# Patient Record
Sex: Female | Born: 1990 | Race: White | Hispanic: No | Marital: Married | Smoking: Never smoker
Health system: Southern US, Community
[De-identification: ages and names within clinical notes are randomized; demographics above are authoritative.]

## PROBLEM LIST (undated history)

## (undated) DIAGNOSIS — N871 Moderate cervical dysplasia: Secondary | ICD-10-CM

## (undated) DIAGNOSIS — F329 Major depressive disorder, single episode, unspecified: Secondary | ICD-10-CM

## (undated) DIAGNOSIS — F419 Anxiety disorder, unspecified: Secondary | ICD-10-CM

## (undated) DIAGNOSIS — F32A Depression, unspecified: Secondary | ICD-10-CM

## (undated) DIAGNOSIS — Z9889 Other specified postprocedural states: Secondary | ICD-10-CM

## (undated) HISTORY — DX: Moderate cervical dysplasia: N87.1

## (undated) HISTORY — DX: Other specified postprocedural states: Z98.890

---
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
## 2016-02-01 HISTORY — PX: OTHER SURGICAL HISTORY: SHX169

## 2018-03-16 DIAGNOSIS — Z0000 Encounter for general adult medical examination without abnormal findings: Secondary | ICD-10-CM | POA: Diagnosis not present

## 2018-04-05 DIAGNOSIS — F321 Major depressive disorder, single episode, moderate: Secondary | ICD-10-CM | POA: Diagnosis not present

## 2018-04-10 DIAGNOSIS — F321 Major depressive disorder, single episode, moderate: Secondary | ICD-10-CM | POA: Diagnosis not present

## 2018-04-16 DIAGNOSIS — F321 Major depressive disorder, single episode, moderate: Secondary | ICD-10-CM | POA: Diagnosis not present

## 2018-04-24 DIAGNOSIS — F321 Major depressive disorder, single episode, moderate: Secondary | ICD-10-CM | POA: Diagnosis not present

## 2018-05-14 DIAGNOSIS — F321 Major depressive disorder, single episode, moderate: Secondary | ICD-10-CM | POA: Diagnosis not present

## 2018-05-28 DIAGNOSIS — F321 Major depressive disorder, single episode, moderate: Secondary | ICD-10-CM | POA: Diagnosis not present

## 2018-06-27 DIAGNOSIS — F321 Major depressive disorder, single episode, moderate: Secondary | ICD-10-CM | POA: Diagnosis not present

## 2019-03-22 DIAGNOSIS — Z131 Encounter for screening for diabetes mellitus: Secondary | ICD-10-CM | POA: Diagnosis not present

## 2019-03-22 DIAGNOSIS — Z23 Encounter for immunization: Secondary | ICD-10-CM | POA: Diagnosis not present

## 2019-03-22 DIAGNOSIS — Z0000 Encounter for general adult medical examination without abnormal findings: Secondary | ICD-10-CM | POA: Diagnosis not present

## 2019-03-22 DIAGNOSIS — E559 Vitamin D deficiency, unspecified: Secondary | ICD-10-CM | POA: Diagnosis not present

## 2019-03-22 DIAGNOSIS — Z13220 Encounter for screening for lipoid disorders: Secondary | ICD-10-CM | POA: Diagnosis not present

## 2019-04-03 ENCOUNTER — Other Ambulatory Visit: Payer: Self-pay | Admitting: Family Medicine

## 2019-04-03 ENCOUNTER — Other Ambulatory Visit (HOSPITAL_COMMUNITY)
Admission: RE | Admit: 2019-04-03 | Discharge: 2019-04-03 | Disposition: A | Discharge status: Home / Self Care | Payer: BC Managed Care – PPO | Source: Ambulatory Visit | Attending: Family Medicine | Admitting: Family Medicine

## 2019-04-03 DIAGNOSIS — Z124 Encounter for screening for malignant neoplasm of cervix: Secondary | ICD-10-CM | POA: Insufficient documentation

## 2019-04-08 ENCOUNTER — Other Ambulatory Visit: Payer: Self-pay | Admitting: Orthopedic Surgery

## 2019-04-08 DIAGNOSIS — M67432 Ganglion, left wrist: Secondary | ICD-10-CM | POA: Diagnosis not present

## 2019-04-12 LAB — CYTOLOGY - PAP
Comment: NEGATIVE
Comment: NEGATIVE
Diagnosis: HIGH — AB
HPV 16: NEGATIVE
HPV 18 / 45: NEGATIVE
High risk HPV: POSITIVE — AB

## 2019-04-25 ENCOUNTER — Ambulatory Visit: Payer: BC Managed Care – PPO | Attending: Internal Medicine

## 2019-04-25 DIAGNOSIS — Z23 Encounter for immunization: Secondary | ICD-10-CM

## 2019-04-25 NOTE — Progress Notes (Signed)
   Covid-19 Vaccination Clinic  Name:  Ashley [name]    MRN: [id number] DOB: 01-11-1991  04/25/2019  Ms. Curl was observed post Covid-19 immunization for 15 minutes without incident. She was provided with Vaccine Information Sheet and instruction to access the V-Safe system.   [name] was instructed to call 911 with any severe reactions post vaccine: Marland Kitchen Difficulty breathing  . Swelling of face and throat  . A fast heartbeat  . A bad rash all over body  . Dizziness and weakness   Immunizations Administered    Name Date Dose VIS Date Route   Pfizer COVID-19 Vaccine 04/25/2019 10:13 AM 0.3 mL 01/11/2019 Intramuscular   Manufacturer: [employer], [employer]   Lot: [id number]   NDC: [address]-5

## 2019-05-02 DIAGNOSIS — Z3169 Encounter for other general counseling and advice on procreation: Secondary | ICD-10-CM | POA: Diagnosis not present

## 2019-05-02 DIAGNOSIS — N871 Moderate cervical dysplasia: Secondary | ICD-10-CM | POA: Diagnosis not present

## 2019-05-02 DIAGNOSIS — Z8759 Personal history of other complications of pregnancy, childbirth and the puerperium: Secondary | ICD-10-CM | POA: Diagnosis not present

## 2019-05-02 DIAGNOSIS — Z3202 Encounter for pregnancy test, result negative: Secondary | ICD-10-CM | POA: Diagnosis not present

## 2019-05-20 ENCOUNTER — Ambulatory Visit: Payer: BC Managed Care – PPO | Attending: Internal Medicine

## 2019-05-20 DIAGNOSIS — Z3169 Encounter for other general counseling and advice on procreation: Secondary | ICD-10-CM | POA: Diagnosis not present

## 2019-05-20 DIAGNOSIS — Z131 Encounter for screening for diabetes mellitus: Secondary | ICD-10-CM | POA: Diagnosis not present

## 2019-05-20 DIAGNOSIS — Z23 Encounter for immunization: Secondary | ICD-10-CM

## 2019-05-20 NOTE — Progress Notes (Signed)
   Covid-19 Vaccination Clinic  Name:  Ashley [name]    MRN: [id number] DOB: 1990-07-29  05/20/2019  [name] was observed post Covid-19 immunization for 15 minutes without incident. She was provided with Vaccine Information Sheet and instruction to access the V-Safe system.   [name] was instructed to call 911 with any severe reactions post vaccine: Marland Kitchen Difficulty breathing  . Swelling of face and throat  . A fast heartbeat  . A bad rash all over body  . Dizziness and weakness   Immunizations Administered    Name Date Dose VIS Date Route   Pfizer COVID-19 Vaccine 05/20/2019  9:52 AM 0.3 mL 03/27/2018 Intramuscular   Manufacturer: [employer], [employer]   Lot: [id number]   NDC: [address]-0

## 2019-05-22 ENCOUNTER — Encounter (HOSPITAL_BASED_OUTPATIENT_CLINIC_OR_DEPARTMENT_OTHER): Payer: Self-pay | Admitting: Orthopedic Surgery

## 2019-05-22 ENCOUNTER — Other Ambulatory Visit: Payer: Self-pay

## 2019-05-24 ENCOUNTER — Other Ambulatory Visit (HOSPITAL_COMMUNITY)
Admission: RE | Admit: 2019-05-24 | Discharge: 2019-05-24 | Disposition: A | Discharge status: Home / Self Care | Payer: BC Managed Care – PPO | Source: Ambulatory Visit | Attending: Orthopedic Surgery | Admitting: Orthopedic Surgery

## 2019-05-24 DIAGNOSIS — Z20822 Contact with and (suspected) exposure to covid-19: Secondary | ICD-10-CM | POA: Insufficient documentation

## 2019-05-24 DIAGNOSIS — Z01812 Encounter for preprocedural laboratory examination: Secondary | ICD-10-CM | POA: Diagnosis not present

## 2019-05-24 LAB — SARS CORONAVIRUS 2 (TAT 6-24 HRS): SARS Coronavirus 2: NEGATIVE

## 2019-05-28 ENCOUNTER — Ambulatory Visit (HOSPITAL_BASED_OUTPATIENT_CLINIC_OR_DEPARTMENT_OTHER): Payer: BC Managed Care – PPO | Admitting: Anesthesiology

## 2019-05-28 ENCOUNTER — Other Ambulatory Visit: Payer: Self-pay

## 2019-05-28 ENCOUNTER — Ambulatory Visit (HOSPITAL_BASED_OUTPATIENT_CLINIC_OR_DEPARTMENT_OTHER)
Admission: RE | Admit: 2019-05-28 | Discharge: 2019-05-28 | Disposition: A | Discharge status: Home / Self Care | Payer: BC Managed Care – PPO | Attending: Orthopedic Surgery | Admitting: Orthopedic Surgery

## 2019-05-28 ENCOUNTER — Encounter (HOSPITAL_BASED_OUTPATIENT_CLINIC_OR_DEPARTMENT_OTHER): Payer: Self-pay | Admitting: Orthopedic Surgery

## 2019-05-28 ENCOUNTER — Encounter (HOSPITAL_BASED_OUTPATIENT_CLINIC_OR_DEPARTMENT_OTHER)
Admission: RE | Disposition: A | Discharge status: Home / Self Care | Payer: Self-pay | Source: Home / Self Care | Attending: Orthopedic Surgery

## 2019-05-28 DIAGNOSIS — R2232 Localized swelling, mass and lump, left upper limb: Secondary | ICD-10-CM | POA: Diagnosis not present

## 2019-05-28 DIAGNOSIS — M67432 Ganglion, left wrist: Secondary | ICD-10-CM | POA: Diagnosis not present

## 2019-05-28 HISTORY — PX: CYST EXCISION: SHX5701

## 2019-05-28 HISTORY — DX: Anxiety disorder, unspecified: F41.9

## 2019-05-28 HISTORY — DX: Depression, unspecified: F32.A

## 2019-05-28 LAB — POCT PREGNANCY, URINE: Preg Test, Ur: NEGATIVE

## 2019-05-28 SURGERY — CYST REMOVAL
Anesthesia: Monitor Anesthesia Care | Site: Wrist | Laterality: Left

## 2019-05-28 MED ORDER — FENTANYL CITRATE (PF) 100 MCG/2ML IJ SOLN: 50.0000 ug | INTRAMUSCULAR | Status: DC | PRN

## 2019-05-28 MED ORDER — FENTANYL CITRATE (PF) 250 MCG/5ML IJ SOLN
INTRAMUSCULAR | Status: DC | PRN
  Administered 2019-05-28: 100 ug via INTRAVENOUS

## 2019-05-28 MED ORDER — BUPIVACAINE HCL (PF) 0.25 % IJ SOLN
INTRAMUSCULAR | Status: DC | PRN
  Administered 2019-05-28: 8 mL

## 2019-05-28 MED ORDER — TRAMADOL HCL 50 MG PO TABS: 50.0000 mg | ORAL_TABLET | Freq: Four times a day (QID) | ORAL | 0 refills | Status: DC | PRN

## 2019-05-28 MED ORDER — MIDAZOLAM HCL 2 MG/2ML IJ SOLN
INTRAMUSCULAR | Status: AC
  Filled 2019-05-28: qty 2

## 2019-05-28 MED ORDER — MIDAZOLAM HCL 5 MG/5ML IJ SOLN
INTRAMUSCULAR | Status: DC | PRN
  Administered 2019-05-28: 2 mg via INTRAVENOUS

## 2019-05-28 MED ORDER — OXYCODONE HCL 5 MG/5ML PO SOLN: 5.0000 mg | Freq: Once | ORAL | Status: DC | PRN

## 2019-05-28 MED ORDER — OXYCODONE HCL 5 MG PO TABS: 5.0000 mg | ORAL_TABLET | Freq: Once | ORAL | Status: DC | PRN

## 2019-05-28 MED ORDER — LACTATED RINGERS IV SOLN: INTRAVENOUS | Status: DC

## 2019-05-28 MED ORDER — CEFAZOLIN SODIUM-DEXTROSE 2-4 GM/100ML-% IV SOLN
INTRAVENOUS | Status: AC
  Filled 2019-05-28: qty 100

## 2019-05-28 MED ORDER — LIDOCAINE HCL (PF) 0.5 % IJ SOLN
INTRAMUSCULAR | Status: DC | PRN
Start: 2019-05-28 — End: 2019-05-28
  Administered 2019-05-28: 45 mL via INTRAVENOUS

## 2019-05-28 MED ORDER — MEPERIDINE HCL 25 MG/ML IJ SOLN: 6.2500 mg | INTRAMUSCULAR | Status: DC | PRN

## 2019-05-28 MED ORDER — CEFAZOLIN SODIUM-DEXTROSE 2-4 GM/100ML-% IV SOLN
2.0000 g | INTRAVENOUS | Status: AC
  Administered 2019-05-28: 2 g via INTRAVENOUS

## 2019-05-28 MED ORDER — PROPOFOL 500 MG/50ML IV EMUL
INTRAVENOUS | Status: DC | PRN
  Administered 2019-05-28: 150 ug/kg/min via INTRAVENOUS

## 2019-05-28 MED ORDER — BUPIVACAINE HCL (PF) 0.25 % IJ SOLN
INTRAMUSCULAR | Status: AC
  Filled 2019-05-28: qty 30

## 2019-05-28 MED ORDER — MIDAZOLAM HCL 2 MG/2ML IJ SOLN: 1.0000 mg | INTRAMUSCULAR | Status: DC | PRN

## 2019-05-28 MED ORDER — ONDANSETRON HCL 4 MG/2ML IJ SOLN: 4.0000 mg | Freq: Once | INTRAMUSCULAR | Status: DC | PRN

## 2019-05-28 MED ORDER — ACETAMINOPHEN 160 MG/5ML PO SOLN: 325.0000 mg | ORAL | Status: DC | PRN

## 2019-05-28 MED ORDER — FENTANYL CITRATE (PF) 100 MCG/2ML IJ SOLN: 25.0000 ug | INTRAMUSCULAR | Status: DC | PRN

## 2019-05-28 MED ORDER — FENTANYL CITRATE (PF) 100 MCG/2ML IJ SOLN
INTRAMUSCULAR | Status: AC
  Filled 2019-05-28: qty 2

## 2019-05-28 MED ORDER — ACETAMINOPHEN 325 MG PO TABS: 325.0000 mg | ORAL_TABLET | ORAL | Status: DC | PRN

## 2019-05-28 SURGICAL SUPPLY — 47 items
BLADE MINI RND TIP GREEN BEAV (BLADE) IMPLANT
BLADE SURG 15 STRL LF DISP TIS (BLADE) ×1 IMPLANT
BLADE SURG 15 STRL SS (BLADE) ×1
BNDG COHESIVE 1X5 TAN STRL LF (GAUZE/BANDAGES/DRESSINGS) IMPLANT
BNDG COHESIVE 2X5 TAN STRL LF (GAUZE/BANDAGES/DRESSINGS) IMPLANT
BNDG COHESIVE 3X5 TAN STRL LF (GAUZE/BANDAGES/DRESSINGS) IMPLANT
BNDG ESMARK 4X9 LF (GAUZE/BANDAGES/DRESSINGS) IMPLANT
BNDG GAUZE ELAST 4 BULKY (GAUZE/BANDAGES/DRESSINGS) IMPLANT
CHLORAPREP W/TINT 26 (MISCELLANEOUS) ×2 IMPLANT
CORD BIPOLAR FORCEPS 12FT (ELECTRODE) ×2 IMPLANT
COVER BACK TABLE 60X90IN (DRAPES) ×2 IMPLANT
COVER MAYO STAND STRL (DRAPES) ×2 IMPLANT
COVER WAND RF STERILE (DRAPES) IMPLANT
CUFF TOURN SGL QUICK 18X4 (TOURNIQUET CUFF) IMPLANT
DECANTER SPIKE VIAL GLASS SM (MISCELLANEOUS) IMPLANT
DRAIN PENROSE 1/2X12 LTX STRL (WOUND CARE) IMPLANT
DRAPE EXTREMITY T 121X128X90 (DISPOSABLE) ×2 IMPLANT
DRAPE SURG 17X23 STRL (DRAPES) ×2 IMPLANT
GAUZE SPONGE 4X4 12PLY STRL (GAUZE/BANDAGES/DRESSINGS) ×2 IMPLANT
GAUZE XEROFORM 1X8 LF (GAUZE/BANDAGES/DRESSINGS) ×2 IMPLANT
GLOVE BIOGEL PI IND STRL 7.0 (GLOVE) ×2 IMPLANT
GLOVE BIOGEL PI IND STRL 8.5 (GLOVE) ×1 IMPLANT
GLOVE BIOGEL PI INDICATOR 7.0 (GLOVE) ×2
GLOVE BIOGEL PI INDICATOR 8.5 (GLOVE) ×1
GLOVE ECLIPSE 6.5 STRL STRAW (GLOVE) ×4 IMPLANT
GLOVE SURG ORTHO 8.0 STRL STRW (GLOVE) ×2 IMPLANT
GOWN STRL REUS W/ TWL LRG LVL3 (GOWN DISPOSABLE) ×1 IMPLANT
GOWN STRL REUS W/TWL LRG LVL3 (GOWN DISPOSABLE) ×1
GOWN STRL REUS W/TWL XL LVL3 (GOWN DISPOSABLE) ×2 IMPLANT
NEEDLE PRECISIONGLIDE 27X1.5 (NEEDLE) IMPLANT
NS IRRIG 1000ML POUR BTL (IV SOLUTION) ×2 IMPLANT
PAD CAST 3X4 CTTN HI CHSV (CAST SUPPLIES) IMPLANT
PADDING CAST ABS 3INX4YD NS (CAST SUPPLIES)
PADDING CAST ABS 4INX4YD NS (CAST SUPPLIES) ×1
PADDING CAST ABS COTTON 3X4 (CAST SUPPLIES) IMPLANT
PADDING CAST ABS COTTON 4X4 ST (CAST SUPPLIES) ×1 IMPLANT
PADDING CAST COTTON 3X4 STRL (CAST SUPPLIES)
SET BASIN DAY SURGERY F.S. (CUSTOM PROCEDURE TRAY) ×2 IMPLANT
SPLINT PLASTER CAST XFAST 3X15 (CAST SUPPLIES) IMPLANT
SPLINT PLASTER XTRA FASTSET 3X (CAST SUPPLIES)
STOCKINETTE 4X48 STRL (DRAPES) ×2 IMPLANT
SUT ETHILON 4 0 PS 2 18 (SUTURE) ×2 IMPLANT
SUT VIC AB 4-0 P2 18 (SUTURE) IMPLANT
SYR BULB EAR ULCER 3OZ GRN STR (SYRINGE) ×2 IMPLANT
SYR CONTROL 10ML LL (SYRINGE) IMPLANT
TOWEL GREEN STERILE FF (TOWEL DISPOSABLE) ×4 IMPLANT
UNDERPAD 30X36 HEAVY ABSORB (UNDERPADS AND DIAPERS) ×2 IMPLANT

## 2019-05-28 NOTE — Transfer of Care (Signed)
Immediate Anesthesia Transfer of Care Note  Patient: Ashley [name]  Procedure(s) Performed: EXCISION OF  DORSAL CYST LEFT WRIST (Left Wrist)  Patient Location: PACU  Anesthesia Type:MAC and Bier block  Level of Consciousness: awake, alert , oriented and patient cooperative  Airway & Oxygen Therapy: Patient Spontanous Breathing and Patient connected to face mask oxygen  Post-op Assessment: Report given to RN, Post -op Vital signs reviewed and stable and Patient moving all extremities  Post vital signs: Reviewed and stable  Last Vitals:  Vitals Value Taken Time  BP    Temp    Pulse 70 05/28/19 0919  Resp 10 05/28/19 0919  SpO2 100 % 05/28/19 0919  Vitals shown include unvalidated device data.  Last Pain:  Vitals:   05/28/19 0731  TempSrc: Tympanic  PainSc: 0-No pain         Complications: No apparent anesthesia complications

## 2019-05-28 NOTE — Discharge Instructions (Addendum)

## 2019-05-28 NOTE — Anesthesia Postprocedure Evaluation (Signed)
Anesthesia Post Note  Patient: Ashley [name]  Procedure(s) Performed: EXCISION OF  DORSAL CYST LEFT WRIST (Left Wrist)     Patient location during evaluation: PACU Anesthesia Type: MAC Level of consciousness: awake and alert Pain management: pain level controlled Vital Signs Assessment: post-procedure vital signs reviewed and stable Respiratory status: spontaneous breathing, nonlabored ventilation, respiratory function stable and patient connected to nasal cannula oxygen Cardiovascular status: stable and blood pressure returned to baseline Postop Assessment: no apparent nausea or vomiting Anesthetic complications: no    Last Vitals:  Vitals:   05/28/19 0930 05/28/19 0955  BP: (!) 84/66 119/63  Pulse: 73 87  Resp: 16 16  Temp:  (!) 36.4 C  SpO2: 100% 98%    Last Pain:  Vitals:   05/28/19 0955  TempSrc:   PainSc: 0-No pain                 Mackenna Kamer

## 2019-05-28 NOTE — Anesthesia Procedure Notes (Signed)
Anesthesia Regional Block: Bier block (IV Regional)   Pre-Anesthetic Checklist: ,, timeout performed, Correct Patient, Correct Site, Correct Laterality, Correct Procedure, Correct Position, site marked, Risks and benefits discussed, Surgical consent,  Pre-op evaluation,  At surgeon's request  Laterality: Left  Prep: alcohol swabs        Procedures:,,,,, intact distal pulses, Esmarch exsanguination, single tourniquet utilized, #20gu IV placed  Narrative:  Start time: 05/28/2019 8:42 AM End time: 05/28/2019 8:42 AM  Events:,, positive IV test,,,,,,,,  Performed by: Personally

## 2019-05-28 NOTE — Op Note (Signed)
NAME: Ashley [name] MEDICAL RECORD NO: [id number] DATE OF BIRTH: 1990/04/17 FACILITY: [facility] LOCATION: [facility] PHYSICIAN: Nicki Reaper, MD   OPERATIVE REPORT   DATE OF PROCEDURE: 05/28/19    PREOPERATIVE DIAGNOSIS:   Mass dorsal aspect left wrist   POSTOPERATIVE DIAGNOSIS:   Same   PROCEDURE:   Excision mass dorsal aspect left wrist   SURGEON: Cindee Salt, M.D.   ASSISTANT: none   ANESTHESIA:  Bier block with sedation and Local   INTRAVENOUS FLUIDS:  Per anesthesia flow sheet.   ESTIMATED BLOOD LOSS:  Minimal.   COMPLICATIONS:  None.   SPECIMENS:   Tenosynovial tissue questionable ganglion cyst   TOURNIQUET TIME:    Total Tourniquet Time Documented: Upper Arm (Left) - 30 minutes Total: Upper Arm (Left) - 30 minutes    DISPOSITION:  Stable to PACU.   INDICATIONS: Patient is a 29 year old female with a mass dorsal aspect of her left wrist she is desirous having this removed.  She is aware that there is no guarantee to the surgery the possibility of infection recurrence injury to arteries nerves tendons complete relief symptoms dystrophy.  Preoperative area the patient is seen the extremity marked with both patient and surgeon the mass outlined.  OPERATIVE COURSE: Patient is brought to the operating room where an upper arm IV regional anesthetic was carried out without difficulty unfortunately during the anesthetic the deflated the cystic mass.  The patient was prepped and draped in supine position with the left arm free.  Prep was done with ChloraPrep a 3-minute dry time allowed timeout taken to confirm patient procedure.  Transverse incision was made over the marked where the mass was.  This carried down through subcutaneous tissue a large amount of hematoma was immediately encountered.  Outlines of what appear to be tenosynovial tissue and the remnant mass were then excised and sent to pathology.  This was then followed proximally placing retractors to  allow visualization back to the radiocarpal joint.  An apparent stalk was identified and followed down to the radiocarpal joint.  This was done by placing retractors retracting the extensor retinaculum dorsally.  The stalk was excised and sent to pathology.  And sending it to pathology.  Bleeders were electrocauterized with bipolar.  Neurovascular structures were identified protected.  The wound was copiously irrigated with saline.  Subcutaneous tissue was closed erupted 4-0 chromic sutures.  The skin was closed with a subcuticular 4-0 Monocryl suture.  Steri-Strips were applied over benzoin.  A local infiltration quarter percent bupivacaine without epinephrine was given approximately 8 cc was used.  A sterile compressive dressing and splint was applied.  Deflation of the tourniquet all fingers immediately pink.  She was taken to the recovery room for observation in satisfactory condition.  She will be discharged home to return [facility] in 1 week on Tylenol ibuprofen for pain with Norco Ultram for breakthrough.   Cindee Salt, MD Electronically signed, 05/28/19

## 2019-05-28 NOTE — Anesthesia Preprocedure Evaluation (Addendum)
Anesthesia Evaluation  Patient identified by MRN, date of birth, ID band Patient awake    Reviewed: Allergy & Precautions, H&P , NPO status , Patient's Chart, lab work & pertinent test results, reviewed documented beta blocker date and time   Airway Mallampati: I  TM Distance: >3 FB Neck ROM: full    Dental no notable dental hx. (+) Teeth Intact, Dental Advisory Given   Pulmonary neg pulmonary ROS,    Pulmonary exam normal breath sounds clear to auscultation       Cardiovascular Exercise Tolerance: Good negative cardio ROS   Rhythm:regular Rate:Normal     Neuro/Psych PSYCHIATRIC DISORDERS Anxiety Depression negative neurological ROS     GI/Hepatic negative GI ROS, Neg liver ROS,   Endo/Other  negative endocrine ROS  Renal/GU negative Renal ROS  negative genitourinary   Musculoskeletal   Abdominal   Peds  Hematology negative hematology ROS (+)   Anesthesia Other Findings   Reproductive/Obstetrics negative OB ROS                            Anesthesia Physical Anesthesia Plan  ASA: II  Anesthesia Plan: MAC and Bier Block and Bier Block-LIDOCAINE ONLY   Post-op Pain Management:    Induction:   PONV Risk Score and Plan: 2 and Treatment may vary due to age or medical condition  Airway Management Planned:   Additional Equipment:   Intra-op Plan:   Post-operative Plan:   Informed Consent: I have reviewed the patients History and Physical, chart, labs and discussed the procedure including the risks, benefits and alternatives for the proposed anesthesia with the patient or authorized representative who has indicated his/her understanding and acceptance.     Dental Advisory Given  Plan Discussed with: CRNA, Anesthesiologist and Surgeon  Anesthesia Plan Comments:         Anesthesia Quick Evaluation

## 2019-05-28 NOTE — Brief Op Note (Signed)
05/28/2019  9:20 AM  PATIENT:  Ashley [name]  29 y.o. female  PRE-OPERATIVE DIAGNOSIS:  DORSAL CYST LEFT WRIST  POST-OPERATIVE DIAGNOSIS:  DORSAL CYST LEFT WRIST  PROCEDURE:  Procedure(s) with comments: EXCISION OF  DORSAL CYST LEFT WRIST (Left) - IV REGIONAL UPPER ARM BLOCK  SURGEON:  Surgeon(s) and Role:    * Cindee Salt, MD - Primary  PHYSICIAN ASSISTANT:   ASSISTANTS: none   ANESTHESIA:   local, regional and IV sedation  EBL:  5 mL   BLOOD ADMINISTERED:none  DRAINS: none   LOCAL MEDICATIONS USED:  BUPIVICAINE   SPECIMEN:  Excision  DISPOSITION OF SPECIMEN:  PATHOLOGY  COUNTS:  YES  TOURNIQUET:   Total Tourniquet Time Documented: Upper Arm (Left) - 30 minutes Total: Upper Arm (Left) - 30 minutes   DICTATION: .Reubin Milan Dictation  PLAN OF CARE: Discharge to home after PACU  PATIENT DISPOSITION:  PACU - hemodynamically stable.

## 2019-05-28 NOTE — H&P (Signed)
  Ashley [name] is an 29 y.o. female.   Chief Complaint: mass left wristHPI: Ashley [name] is a 29 year old right-hand-dominant female referred by Dr. Hyacinth Meeker for consultation regarding a mass on the dorsal aspect of her left wrist. She states this has been present for approximately a 1 year. She recalls no history of injury. She complains of mild discomfort with gripping with a VAS score up to 6/10 normally she has slight discomfort throbbing pain 3/10. Become sharp with gripping. She has not had any treatment other than trying to break it. She states that she will occasionally have slight numbness and tingling on the dorsal aspect of her index finger if she hits the area. She states nothing seems to make it better or worse. She has no history of diabetes thyroid problems arthritis or gout. Family history is positive diabetes and arthritis negative for the remainder.     Past Medical History:  Diagnosis Date  . Anxiety   . Depression     Past Surgical History:  Procedure Laterality Date  . laproscopic surgery for ruptured fallopian tube  2018    History reviewed. No pertinent family history. Social History:  reports that she has never smoked. She has never used smokeless tobacco. She reports current alcohol use. She reports that she does not use drugs.  Allergies: No Known Allergies  No medications prior to admission.    No results found for this or any previous visit (from the past 48 hour(s)).  No results found.   Pertinent items are noted in HPI.  Height 5\' 5"  (1.651 m), weight 86.6 kg, last menstrual period 05/18/2019.  General appearance: alert, cooperative and appears stated age Head: Normocephalic, without obvious abnormality Neck: no carotid bruit and no JVD Resp: clear to auscultation bilaterally Cardio: regular rate and rhythm, S1, S2 normal, no murmur, click, rub or gallop GI: soft, non-tender; bowel sounds normal; no masses,  no organomegaly Extremities: mass left  wrist Pulses: 2+ and symmetric Skin: Skin color, texture, turgor normal. No rashes or lesions Neurologic: Grossly normal Incision/Wound: na  Assessment/Plan Assessment:  1. Ganglion of left wrist     Plan: We have discussed the etiology of wrist ganglions with her. We have discussed various treatment alternatives including observation anti-inflammatory splinting aspiration clysis or surgical excision. She states she would like to have it removed. Preperi-and postoperative course are discussed along with risk and complications. She is aware that there is no guarantee to the surgery the possibility of infection recurrence injury to arteries nerves tendons incomplete relief symptoms dystrophy. She is advised that there is a 10% recurrence rate with dorsal wrist ganglions. Questions are encouraged and answered to her satisfaction Dr. 05/20/2019 notes are reviewed. She is scheduled for excision dorsal wrist ganglion left wrist as an outpatient under regional anesthesia.      Rondel Baton 05/28/2019, 6:35 AM

## 2019-05-29 ENCOUNTER — Encounter: Payer: Self-pay | Admitting: *Deleted

## 2019-05-29 LAB — SURGICAL PATHOLOGY

## 2019-05-31 DIAGNOSIS — Z3202 Encounter for pregnancy test, result negative: Secondary | ICD-10-CM | POA: Diagnosis not present

## 2019-05-31 DIAGNOSIS — D069 Carcinoma in situ of cervix, unspecified: Secondary | ICD-10-CM | POA: Diagnosis not present

## 2019-05-31 DIAGNOSIS — F418 Other specified anxiety disorders: Secondary | ICD-10-CM | POA: Diagnosis not present

## 2019-05-31 DIAGNOSIS — R102 Pelvic and perineal pain: Secondary | ICD-10-CM | POA: Diagnosis not present

## 2019-06-24 DIAGNOSIS — N979 Female infertility, unspecified: Secondary | ICD-10-CM | POA: Diagnosis not present

## 2019-07-05 DIAGNOSIS — E559 Vitamin D deficiency, unspecified: Secondary | ICD-10-CM | POA: Diagnosis not present

## 2019-07-16 ENCOUNTER — Other Ambulatory Visit: Payer: Self-pay | Admitting: Obstetrics and Gynecology

## 2019-07-16 DIAGNOSIS — N979 Female infertility, unspecified: Secondary | ICD-10-CM

## 2019-07-22 ENCOUNTER — Ambulatory Visit
Admission: RE | Admit: 2019-07-22 | Discharge: 2019-07-22 | Disposition: A | Discharge status: Home / Self Care | Payer: BC Managed Care – PPO | Source: Ambulatory Visit | Attending: Obstetrics and Gynecology | Admitting: Obstetrics and Gynecology

## 2019-07-22 DIAGNOSIS — N979 Female infertility, unspecified: Secondary | ICD-10-CM

## 2019-07-22 DIAGNOSIS — Z3141 Encounter for fertility testing: Secondary | ICD-10-CM | POA: Diagnosis not present

## 2019-08-01 DIAGNOSIS — N979 Female infertility, unspecified: Secondary | ICD-10-CM | POA: Diagnosis not present

## 2019-08-19 DIAGNOSIS — Z3169 Encounter for other general counseling and advice on procreation: Secondary | ICD-10-CM | POA: Diagnosis not present

## 2020-01-29 DIAGNOSIS — Z8759 Personal history of other complications of pregnancy, childbirth and the puerperium: Secondary | ICD-10-CM | POA: Diagnosis not present

## 2020-02-05 DIAGNOSIS — Z8759 Personal history of other complications of pregnancy, childbirth and the puerperium: Secondary | ICD-10-CM | POA: Diagnosis not present

## 2020-02-07 DIAGNOSIS — Z8759 Personal history of other complications of pregnancy, childbirth and the puerperium: Secondary | ICD-10-CM | POA: Diagnosis not present

## 2020-02-10 DIAGNOSIS — Z8759 Personal history of other complications of pregnancy, childbirth and the puerperium: Secondary | ICD-10-CM | POA: Diagnosis not present

## 2020-02-19 DIAGNOSIS — Z3480 Encounter for supervision of other normal pregnancy, unspecified trimester: Secondary | ICD-10-CM | POA: Diagnosis not present

## 2020-02-19 DIAGNOSIS — Z3201 Encounter for pregnancy test, result positive: Secondary | ICD-10-CM | POA: Diagnosis not present

## 2020-02-26 DIAGNOSIS — O26841 Uterine size-date discrepancy, first trimester: Secondary | ICD-10-CM | POA: Diagnosis not present

## 2020-02-26 DIAGNOSIS — Z8759 Personal history of other complications of pregnancy, childbirth and the puerperium: Secondary | ICD-10-CM | POA: Diagnosis not present

## 2020-03-09 DIAGNOSIS — Z3490 Encounter for supervision of normal pregnancy, unspecified, unspecified trimester: Secondary | ICD-10-CM | POA: Diagnosis not present

## 2020-03-09 DIAGNOSIS — Z3A22 22 weeks gestation of pregnancy: Secondary | ICD-10-CM | POA: Diagnosis not present

## 2020-03-09 DIAGNOSIS — Z3482 Encounter for supervision of other normal pregnancy, second trimester: Secondary | ICD-10-CM | POA: Diagnosis not present

## 2020-03-09 DIAGNOSIS — R5383 Other fatigue: Secondary | ICD-10-CM | POA: Diagnosis not present

## 2020-04-06 DIAGNOSIS — R5383 Other fatigue: Secondary | ICD-10-CM | POA: Diagnosis not present

## 2020-04-06 DIAGNOSIS — Z3482 Encounter for supervision of other normal pregnancy, second trimester: Secondary | ICD-10-CM | POA: Diagnosis not present

## 2020-04-08 DIAGNOSIS — Z3A13 13 weeks gestation of pregnancy: Secondary | ICD-10-CM | POA: Diagnosis not present

## 2020-04-08 DIAGNOSIS — O3441 Maternal care for other abnormalities of cervix, first trimester: Secondary | ICD-10-CM | POA: Diagnosis not present

## 2020-05-11 DIAGNOSIS — Z36 Encounter for antenatal screening for chromosomal anomalies: Secondary | ICD-10-CM | POA: Diagnosis not present

## 2020-05-11 DIAGNOSIS — Z3482 Encounter for supervision of other normal pregnancy, second trimester: Secondary | ICD-10-CM | POA: Diagnosis not present

## 2020-05-25 DIAGNOSIS — Z3A20 20 weeks gestation of pregnancy: Secondary | ICD-10-CM | POA: Diagnosis not present

## 2020-05-25 DIAGNOSIS — O99212 Obesity complicating pregnancy, second trimester: Secondary | ICD-10-CM | POA: Diagnosis not present

## 2020-05-25 DIAGNOSIS — Z362 Encounter for other antenatal screening follow-up: Secondary | ICD-10-CM | POA: Diagnosis not present

## 2020-05-25 DIAGNOSIS — Z3482 Encounter for supervision of other normal pregnancy, second trimester: Secondary | ICD-10-CM | POA: Diagnosis not present

## 2020-06-08 DIAGNOSIS — Z0183 Encounter for blood typing: Secondary | ICD-10-CM | POA: Diagnosis not present

## 2020-06-08 DIAGNOSIS — Z719 Counseling, unspecified: Secondary | ICD-10-CM | POA: Diagnosis not present

## 2020-06-08 DIAGNOSIS — Z3482 Encounter for supervision of other normal pregnancy, second trimester: Secondary | ICD-10-CM | POA: Diagnosis not present

## 2020-06-08 DIAGNOSIS — Z6832 Body mass index (BMI) 32.0-32.9, adult: Secondary | ICD-10-CM | POA: Diagnosis not present

## 2020-06-09 DIAGNOSIS — G4700 Insomnia, unspecified: Secondary | ICD-10-CM | POA: Diagnosis not present

## 2020-06-09 DIAGNOSIS — G4730 Sleep apnea, unspecified: Secondary | ICD-10-CM | POA: Diagnosis not present

## 2020-06-09 DIAGNOSIS — E669 Obesity, unspecified: Secondary | ICD-10-CM | POA: Diagnosis not present

## 2020-06-09 DIAGNOSIS — F458 Other somatoform disorders: Secondary | ICD-10-CM | POA: Diagnosis not present

## 2020-06-17 DIAGNOSIS — G4733 Obstructive sleep apnea (adult) (pediatric): Secondary | ICD-10-CM | POA: Diagnosis not present

## 2020-06-18 DIAGNOSIS — G4733 Obstructive sleep apnea (adult) (pediatric): Secondary | ICD-10-CM | POA: Diagnosis not present

## 2020-06-30 DIAGNOSIS — Z3482 Encounter for supervision of other normal pregnancy, second trimester: Secondary | ICD-10-CM | POA: Diagnosis not present

## 2020-07-21 DIAGNOSIS — Z3403 Encounter for supervision of normal first pregnancy, third trimester: Secondary | ICD-10-CM | POA: Diagnosis not present

## 2020-09-01 DIAGNOSIS — O322XX1 Maternal care for transverse and oblique lie, fetus 1: Secondary | ICD-10-CM | POA: Diagnosis not present

## 2020-09-16 DIAGNOSIS — Z3403 Encounter for supervision of normal first pregnancy, third trimester: Secondary | ICD-10-CM | POA: Diagnosis not present

## 2021-05-11 DIAGNOSIS — F419 Anxiety disorder, unspecified: Secondary | ICD-10-CM | POA: Insufficient documentation

## 2021-12-06 IMAGING — RF DG HYSTEROGRAM
1 series · 7 of 7 positions shown · IV contrast (omnipaque)
Comparison: None.

CLINICAL DATA: Female infertility evaluation. History of left
salpingectomy for ectopic pregnancy in 9483.

EXAM:
HYSTEROSALPINGOGRAM
TECHNIQUE: Following cleansing of the cervix and vagina with Betadine solution,
a hysterosalpingogram was performed using a 5-French
hysterosalpingogram catheter and Omnipaque 300 contrast. The patient
tolerated the examination without difficulty.

[Series 1: one shot · 7 of 7 slices shown]
[im 1/7]
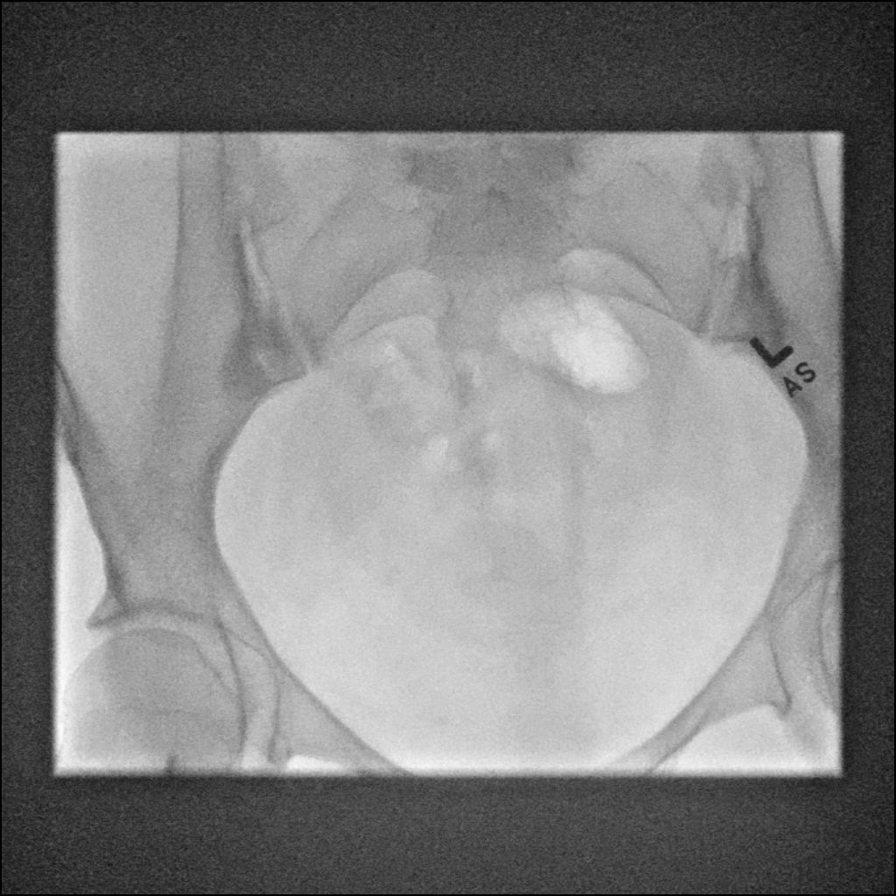
[im 2/7]
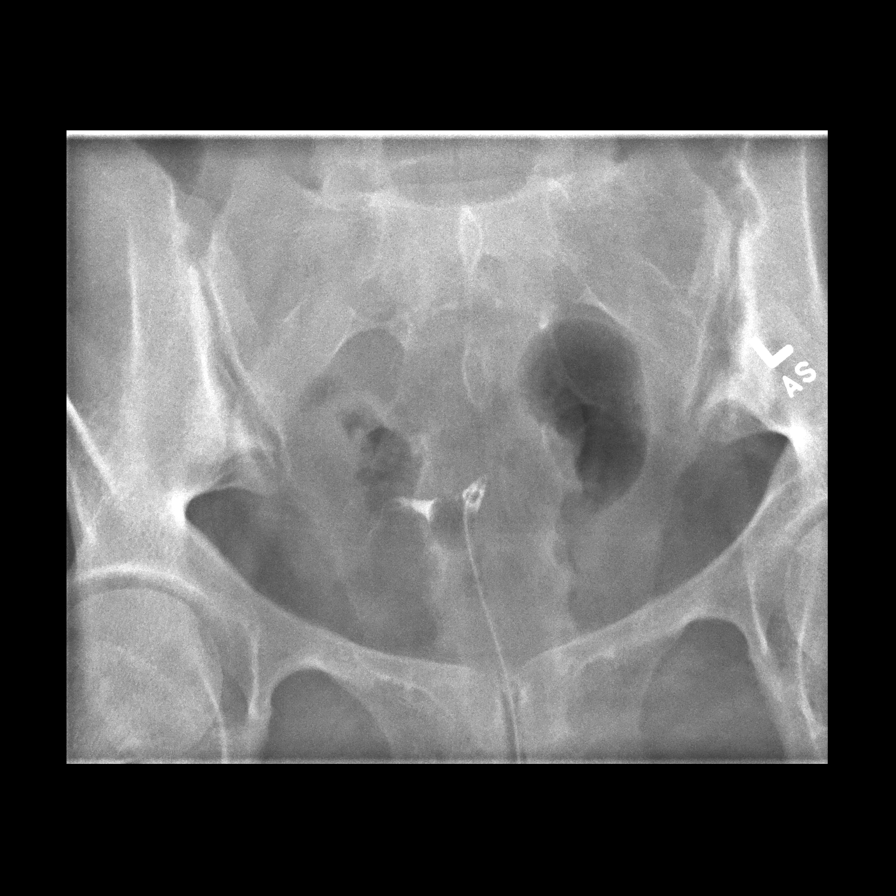
[im 3/7]
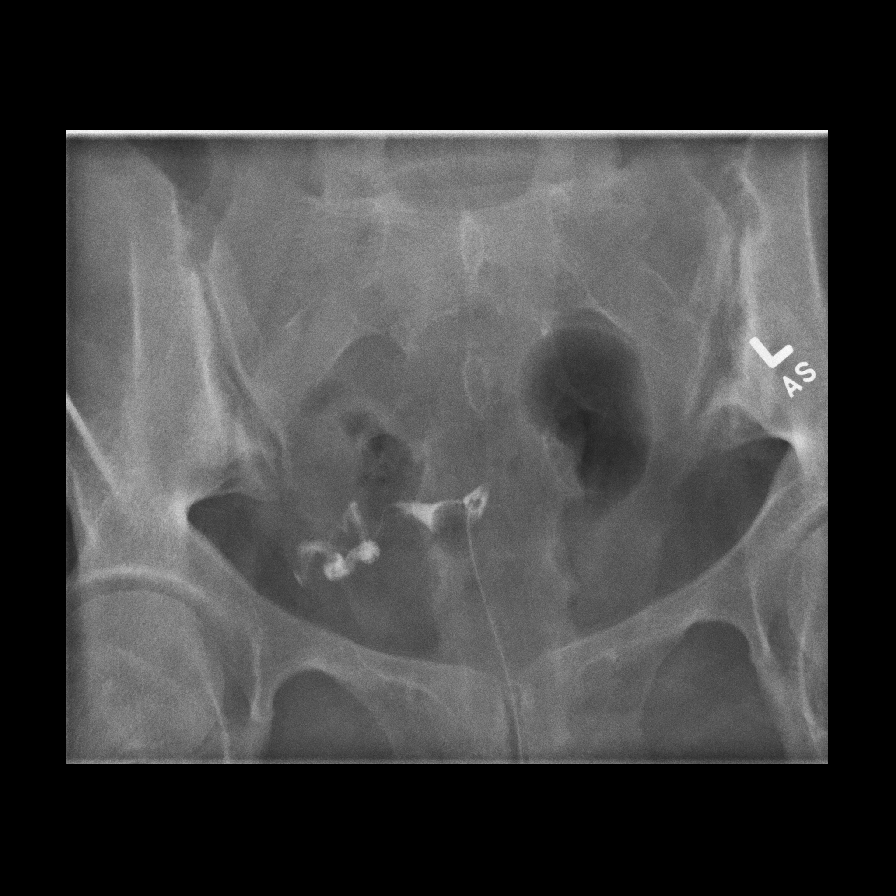
[im 4/7]
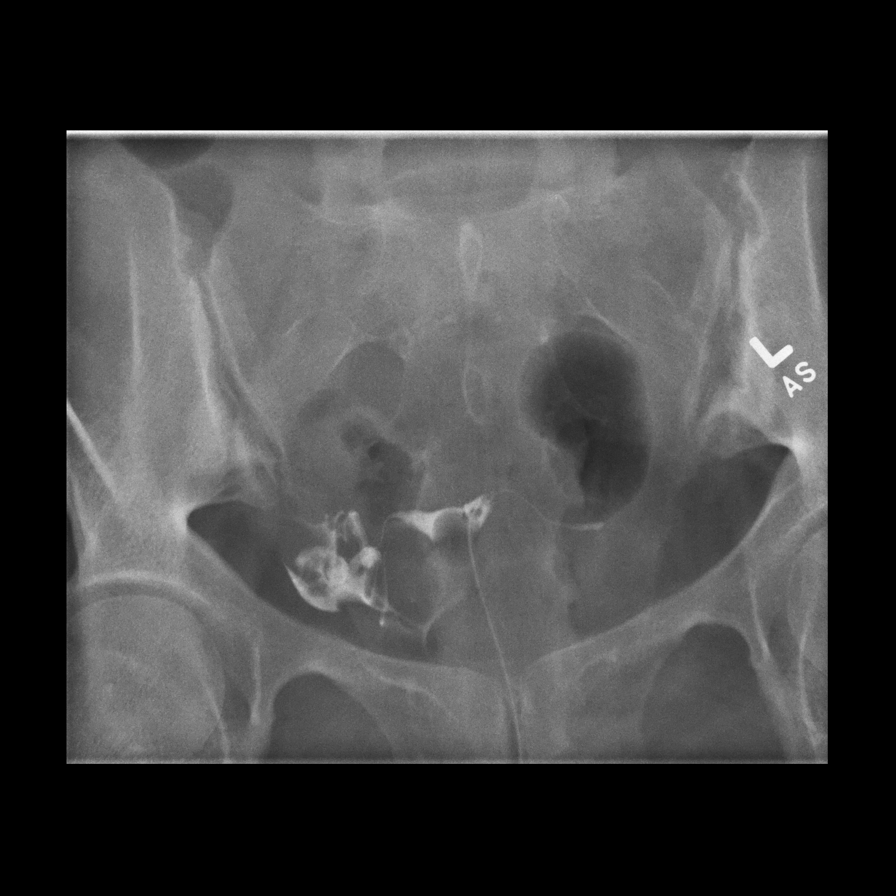
[im 5/7]
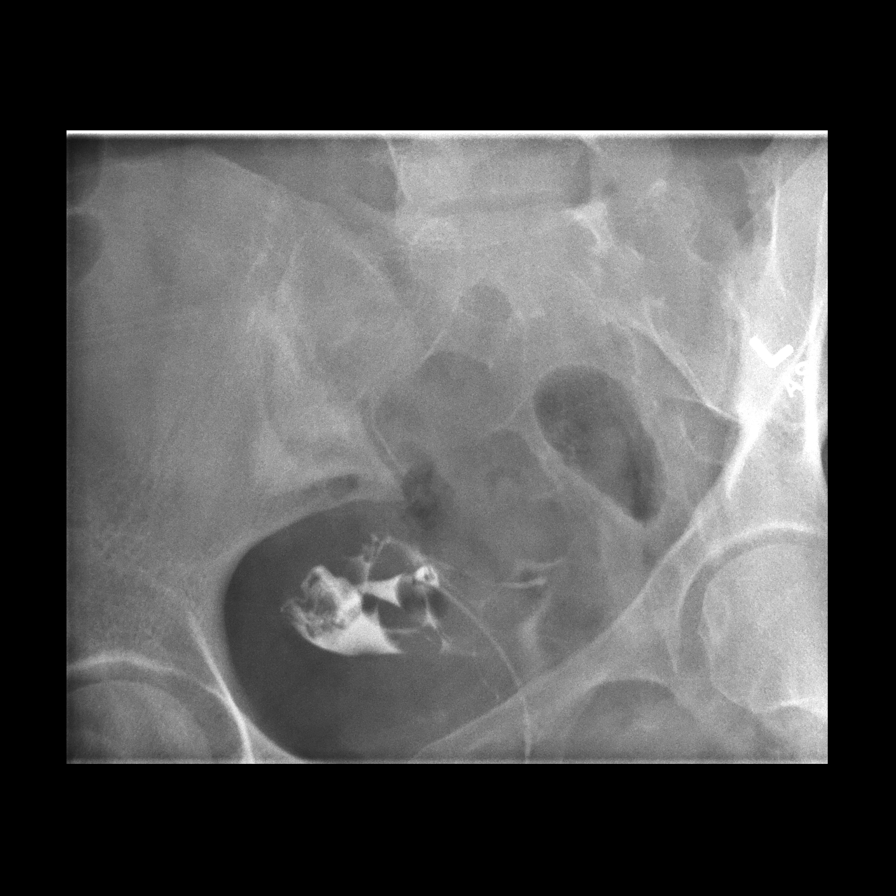
[im 6/7]
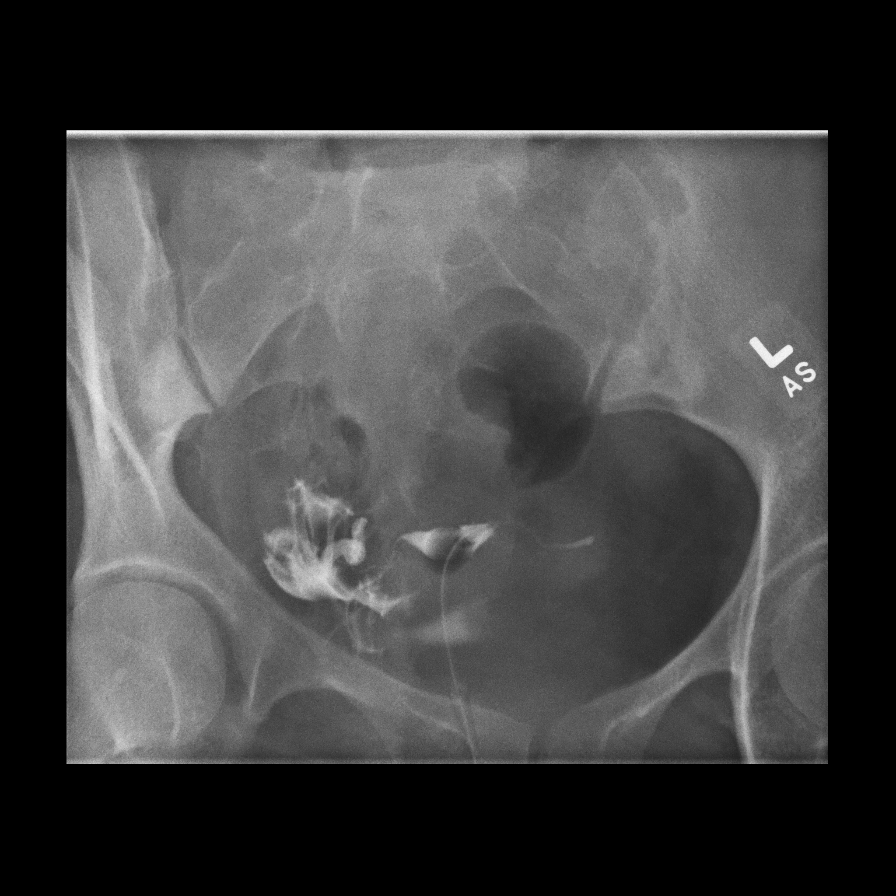
[im 7/7]
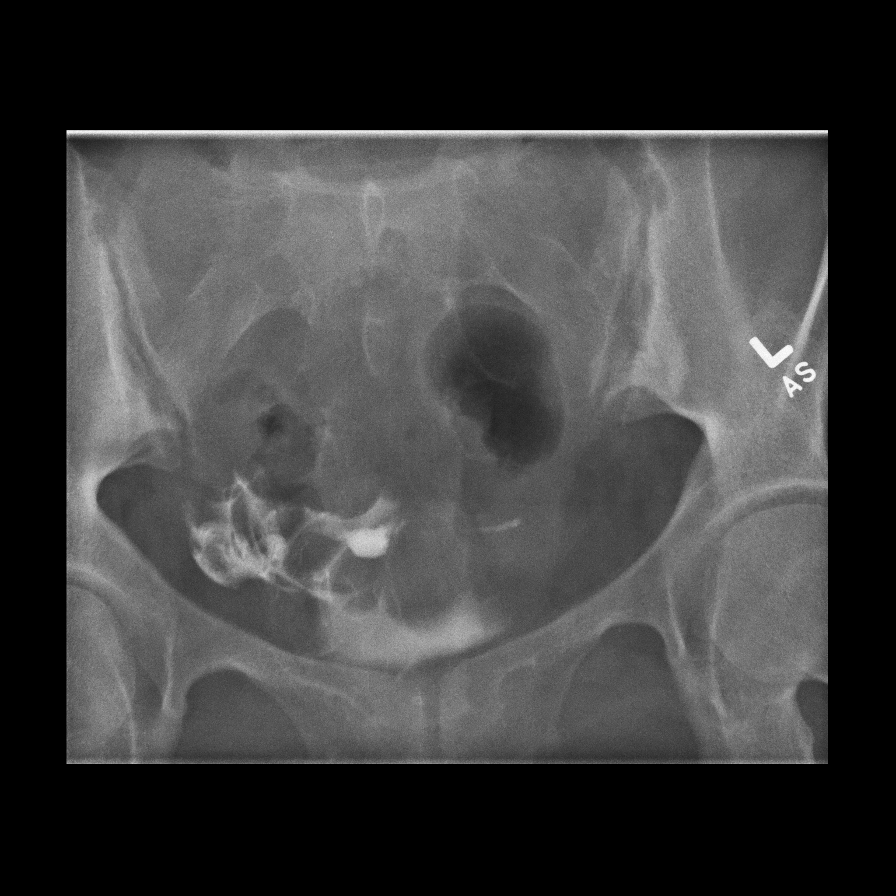

[7 of 7 positions shown; findings below may reference images not displayed]

FLUOROSCOPY TIME:  Radiation Exposure Index (as provided by the
fluoroscopic device): 127 mGy

Fluoroscopy Time:  0 minutes 54 seconds

Number of Acquired Images:  6
FINDINGS: Normal uterine cavity contour, with no persistent uterine cavity
filling defects. Left fallopian tube is occluded proximally at the
level of the proximal ampullary segment compatible with history of
left salpingectomy. Prompt opacification of the right fallopian
tube, which is normal in caliber and appearance. Normal spillage of
contrast from the fimbriated end of the right fallopian tube with
normal dispersal of contrast within the right peritoneal cavity.
IMPRESSION: 1. Normal patent right fallopian tube.
2. Proximal occlusion of the left fallopian tube, compatible with
history of left salpingectomy.
3. Normal uterine cavity.

## 2022-06-25 DIAGNOSIS — O99810 Abnormal glucose complicating pregnancy: Secondary | ICD-10-CM | POA: Insufficient documentation

## 2023-07-20 ENCOUNTER — Other Ambulatory Visit: Payer: Self-pay

## 2023-07-20 ENCOUNTER — Emergency Department (HOSPITAL_COMMUNITY)

## 2023-07-20 ENCOUNTER — Inpatient Hospital Stay (HOSPITAL_COMMUNITY)
Admission: EM | Admit: 2023-07-20 | Discharge: 2023-07-23 | DRG: 872 | Disposition: A | Discharge status: Home / Self Care | Attending: Internal Medicine | Admitting: Internal Medicine

## 2023-07-20 ENCOUNTER — Encounter (HOSPITAL_COMMUNITY): Payer: Self-pay

## 2023-07-20 DIAGNOSIS — R590 Localized enlarged lymph nodes: Secondary | ICD-10-CM | POA: Diagnosis present

## 2023-07-20 DIAGNOSIS — J329 Chronic sinusitis, unspecified: Secondary | ICD-10-CM | POA: Diagnosis present

## 2023-07-20 DIAGNOSIS — J029 Acute pharyngitis, unspecified: Secondary | ICD-10-CM

## 2023-07-20 DIAGNOSIS — J039 Acute tonsillitis, unspecified: Secondary | ICD-10-CM | POA: Diagnosis present

## 2023-07-20 DIAGNOSIS — E860 Dehydration: Secondary | ICD-10-CM | POA: Diagnosis present

## 2023-07-20 DIAGNOSIS — R21 Rash and other nonspecific skin eruption: Secondary | ICD-10-CM | POA: Diagnosis not present

## 2023-07-20 DIAGNOSIS — A419 Sepsis, unspecified organism: Principal | ICD-10-CM | POA: Diagnosis present

## 2023-07-20 DIAGNOSIS — E871 Hypo-osmolality and hyponatremia: Secondary | ICD-10-CM | POA: Diagnosis present

## 2023-07-20 DIAGNOSIS — F32A Depression, unspecified: Secondary | ICD-10-CM | POA: Diagnosis present

## 2023-07-20 DIAGNOSIS — Z79899 Other long term (current) drug therapy: Secondary | ICD-10-CM

## 2023-07-20 DIAGNOSIS — F419 Anxiety disorder, unspecified: Secondary | ICD-10-CM | POA: Diagnosis present

## 2023-07-20 DIAGNOSIS — J320 Chronic maxillary sinusitis: Secondary | ICD-10-CM | POA: Diagnosis present

## 2023-07-20 DIAGNOSIS — Z1152 Encounter for screening for COVID-19: Secondary | ICD-10-CM

## 2023-07-20 LAB — COMPREHENSIVE METABOLIC PANEL WITH GFR
ALT: 37 U/L (ref 0–44)
AST: 22 U/L (ref 15–41)
Albumin: 3.3 g/dL — ABNORMAL LOW (ref 3.5–5.0)
Alkaline Phosphatase: 62 U/L (ref 38–126)
Anion gap: 10 (ref 5–15)
BUN: 6 mg/dL (ref 6–20)
CO2: 22 mmol/L (ref 22–32)
Calcium: 8.7 mg/dL — ABNORMAL LOW (ref 8.9–10.3)
Chloride: 101 mmol/L (ref 98–111)
Creatinine, Ser: 0.76 mg/dL (ref 0.44–1.00)
GFR, Estimated: >60 mL/min (ref >60)
Glucose, Bld: 141 mg/dL — ABNORMAL HIGH (ref 70–99)
Potassium: 4.3 mmol/L (ref 3.5–5.1)
Sodium: 133 mmol/L — ABNORMAL LOW (ref 135–145)
Total Bilirubin: 0.8 mg/dL (ref 0.0–1.2)
Total Protein: 7.5 g/dL (ref 6.5–8.1)

## 2023-07-20 LAB — CBC WITH DIFFERENTIAL/PLATELET
Abs Immature Granulocytes: 0.06 10*3/uL (ref 0.00–0.07)
Basophils Absolute: 0.1 10*3/uL (ref 0.0–0.1)
Basophils Relative: 0 %
Eosinophils Absolute: 0 10*3/uL (ref 0.0–0.5)
Eosinophils Relative: 0 %
HCT: 47.2 % — ABNORMAL HIGH (ref 36.0–46.0)
Hemoglobin: 15.6 g/dL — ABNORMAL HIGH (ref 12.0–15.0)
Immature Granulocytes: 0 %
Lymphocytes Relative: 7 %
Lymphs Abs: 1.1 10*3/uL (ref 0.7–4.0)
MCH: 28.8 pg (ref 26.0–34.0)
MCHC: 33.1 g/dL (ref 30.0–36.0)
MCV: 87.1 fL (ref 80.0–100.0)
Monocytes Absolute: 0.8 10*3/uL (ref 0.1–1.0)
Monocytes Relative: 6 %
Neutro Abs: 12.6 10*3/uL — ABNORMAL HIGH (ref 1.7–7.7)
Neutrophils Relative %: 87 %
Platelets: 238 10*3/uL (ref 150–400)
RBC: 5.42 MIL/uL — ABNORMAL HIGH (ref 3.87–5.11)
RDW: 13.2 % (ref 11.5–15.5)
WBC: 14.6 10*3/uL — ABNORMAL HIGH (ref 4.0–10.5)
nRBC: 0 % (ref 0.0–0.2)

## 2023-07-20 LAB — HCG, SERUM, QUALITATIVE: Preg, Serum: NEGATIVE

## 2023-07-20 LAB — I-STAT CG4 LACTIC ACID, ED: Lactic Acid, Venous: 1 mmol/L (ref 0.5–1.9)

## 2023-07-20 LAB — PROTIME-INR
INR: 1.1 (ref 0.8–1.2)
Prothrombin Time: 14.8 s (ref 11.4–15.2)

## 2023-07-20 LAB — RESP PANEL BY RT-PCR (RSV, FLU A&B, COVID)  RVPGX2
Influenza A by PCR: NEGATIVE
Influenza B by PCR: NEGATIVE
Resp Syncytial Virus by PCR: NEGATIVE
SARS Coronavirus 2 by RT PCR: NEGATIVE

## 2023-07-20 LAB — GROUP A STREP BY PCR: Group A Strep by PCR: NOT DETECTED

## 2023-07-20 MED ORDER — SODIUM CHLORIDE 0.9 % IV SOLN
2.0000 g | Freq: Once | INTRAVENOUS | Status: AC
  Administered 2023-07-20: 2 g via INTRAVENOUS
  Filled 2023-07-20: qty 12.5

## 2023-07-20 MED ORDER — METRONIDAZOLE 500 MG/100ML IV SOLN
500.0000 mg | Freq: Once | INTRAVENOUS | Status: AC
  Administered 2023-07-20: 500 mg via INTRAVENOUS
  Filled 2023-07-20: qty 100

## 2023-07-20 MED ORDER — LACTATED RINGERS IV BOLUS (SEPSIS)
1000.0000 mL | Freq: Once | INTRAVENOUS | Status: AC
  Administered 2023-07-20: 1000 mL via INTRAVENOUS

## 2023-07-20 MED ORDER — LACTATED RINGERS IV SOLN: INTRAVENOUS | Status: AC

## 2023-07-20 MED ORDER — VANCOMYCIN HCL 2000 MG/400ML IV SOLN
2000.0000 mg | Freq: Once | INTRAVENOUS | Status: AC
  Administered 2023-07-21: 2000 mg via INTRAVENOUS
  Filled 2023-07-20: qty 400

## 2023-07-20 MED ORDER — VANCOMYCIN HCL IN DEXTROSE 1-5 GM/200ML-% IV SOLN: 1000.0000 mg | Freq: Once | INTRAVENOUS | Status: DC

## 2023-07-20 MED ORDER — LACTATED RINGERS IV BOLUS (SEPSIS)
1000.0000 mL | Freq: Once | INTRAVENOUS | Status: AC
  Administered 2023-07-21: 1000 mL via INTRAVENOUS

## 2023-07-20 NOTE — ED Triage Notes (Signed)
 Says she has been having sore throat, fevers of 101 at home, nasal congestion, and rash on chest that manifested 2 days ago.

## 2023-07-20 NOTE — ED Provider Triage Note (Cosign Needed)
 Emergency Medicine Provider Triage Evaluation Note  Ashley [name] , a 33 y.o. female  was evaluated in triage.  Pt complains of sore throat and new onset of of rash.  States that sore throat has been present for several weeks however the sore throat has worsened, and she now has persistent fatigue as well as a skin rash that has spread from the face to the upper chest and down both arms and is notable on the palms bilaterally.  Review of Systems  Positive: Sore throat, dyne aphasia, new onset skin rash, fatigue, fever Negative:   Physical Exam  BP (!) 150/99 (BP Location: Right Arm)   Pulse (!) 137   Temp (!) 100.7 F (38.2 C)   Resp 18   SpO2 96%  Gen:   Awake, no distress   Resp:  Normal effort  MSK:   Moves extremities without difficulty  Other:  Petechial rash noted to the face, superior anterior thorax, down bilateral upper extremities and is not sparing of the palms.  Posterior oropharynx is remarkable for tonsillar edema with thick white tonsillar exudates noted.  She has anterior cervical lymphadenopathy noted.  Medical Decision Making  Medically screening exam initiated at 9:59 PM.  Appropriate orders placed.  Shaili Donalson was informed that the remainder of the evaluation will be completed by another provider, this initial triage assessment does not replace that evaluation, and the importance of remaining in the ED until their evaluation is complete.  Due to patient's fever along with tachycardia and symptoms consistent with streptococcal pharyngitis and likely further beta streptococcal faction, initiate sepsis workup, assess for strep and for viral etiology.   Juanetta Nordmann, [place] 07/20/23 2201

## 2023-07-20 NOTE — Sepsis Progress Note (Signed)
 Elink monitoring for the code sepsis protocol.

## 2023-07-21 ENCOUNTER — Ambulatory Visit: Payer: Self-pay

## 2023-07-21 ENCOUNTER — Emergency Department (HOSPITAL_COMMUNITY)

## 2023-07-21 DIAGNOSIS — F32A Depression, unspecified: Secondary | ICD-10-CM | POA: Diagnosis present

## 2023-07-21 DIAGNOSIS — A419 Sepsis, unspecified organism: Secondary | ICD-10-CM | POA: Diagnosis present

## 2023-07-21 DIAGNOSIS — Z1152 Encounter for screening for COVID-19: Secondary | ICD-10-CM | POA: Diagnosis not present

## 2023-07-21 DIAGNOSIS — Z79899 Other long term (current) drug therapy: Secondary | ICD-10-CM | POA: Diagnosis not present

## 2023-07-21 DIAGNOSIS — J320 Chronic maxillary sinusitis: Secondary | ICD-10-CM | POA: Diagnosis present

## 2023-07-21 DIAGNOSIS — R509 Fever, unspecified: Secondary | ICD-10-CM | POA: Diagnosis not present

## 2023-07-21 DIAGNOSIS — J039 Acute tonsillitis, unspecified: Secondary | ICD-10-CM | POA: Diagnosis present

## 2023-07-21 DIAGNOSIS — J019 Acute sinusitis, unspecified: Secondary | ICD-10-CM

## 2023-07-21 DIAGNOSIS — R21 Rash and other nonspecific skin eruption: Secondary | ICD-10-CM | POA: Diagnosis present

## 2023-07-21 DIAGNOSIS — E871 Hypo-osmolality and hyponatremia: Secondary | ICD-10-CM | POA: Diagnosis present

## 2023-07-21 DIAGNOSIS — J329 Chronic sinusitis, unspecified: Secondary | ICD-10-CM | POA: Diagnosis present

## 2023-07-21 DIAGNOSIS — E860 Dehydration: Secondary | ICD-10-CM | POA: Diagnosis present

## 2023-07-21 DIAGNOSIS — F419 Anxiety disorder, unspecified: Secondary | ICD-10-CM | POA: Diagnosis present

## 2023-07-21 DIAGNOSIS — R590 Localized enlarged lymph nodes: Secondary | ICD-10-CM | POA: Diagnosis present

## 2023-07-21 LAB — URINALYSIS, W/ REFLEX TO CULTURE (INFECTION SUSPECTED)
Bacteria, UA: NONE SEEN
Bilirubin Urine: NEGATIVE
Glucose, UA: NEGATIVE mg/dL
Ketones, ur: 5 mg/dL — AB
Nitrite: NEGATIVE
Protein, ur: 30 mg/dL — AB
Specific Gravity, Urine: 1.024 (ref 1.005–1.030)
pH: 7 (ref 5.0–8.0)

## 2023-07-21 LAB — MENINGITIS/ENCEPHALITIS PANEL (CSF)

## 2023-07-21 LAB — GLUCOSE, CSF: Glucose, CSF: 77 mg/dL — ABNORMAL HIGH (ref 40–70)

## 2023-07-21 LAB — MONONUCLEOSIS SCREEN: Mono Screen: NEGATIVE

## 2023-07-21 LAB — CRYPTOCOCCAL ANTIGEN, CSF: Crypto Ag: NEGATIVE

## 2023-07-21 LAB — CSF CELL COUNT WITH DIFFERENTIAL
RBC Count, CSF: 67 /mm3 — ABNORMAL HIGH
Tube #: 4
WBC, CSF: 2 /mm3 (ref 0–5)
WBC, CSF: 3 /mm3 (ref 0–5)

## 2023-07-21 LAB — PROTEIN, CSF: Total  Protein, CSF: 28 mg/dL (ref 15–45)

## 2023-07-21 LAB — RPR: RPR Ser Ql: NONREACTIVE

## 2023-07-21 LAB — HIV ANTIBODY (ROUTINE TESTING W REFLEX): HIV Screen 4th Generation wRfx: NONREACTIVE

## 2023-07-21 MED ORDER — GUAIFENESIN ER 600 MG PO TB12
600.0000 mg | ORAL_TABLET | Freq: Two times a day (BID) | ORAL | Status: DC
  Administered 2023-07-21 – 2023-07-23 (×5): 600 mg via ORAL
  Filled 2023-07-21 (×6): qty 1

## 2023-07-21 MED ORDER — ONDANSETRON HCL 4 MG/2ML IJ SOLN: 4.0000 mg | Freq: Four times a day (QID) | INTRAMUSCULAR | Status: DC | PRN

## 2023-07-21 MED ORDER — MORPHINE SULFATE (PF) 2 MG/ML IV SOLN: 2.0000 mg | INTRAVENOUS | Status: DC | PRN

## 2023-07-21 MED ORDER — IOHEXOL 350 MG/ML SOLN
75.0000 mL | Freq: Once | INTRAVENOUS | Status: AC | PRN
  Administered 2023-07-21: 75 mL via INTRAVENOUS

## 2023-07-21 MED ORDER — MENTHOL 3 MG MT LOZG
1.0000 | LOZENGE | OROMUCOSAL | Status: DC | PRN
  Administered 2023-07-21 (×2): 3 mg via ORAL
  Filled 2023-07-21 (×2): qty 9

## 2023-07-21 MED ORDER — SODIUM CHLORIDE 0.9% FLUSH
3.0000 mL | Freq: Two times a day (BID) | INTRAVENOUS | Status: DC
  Administered 2023-07-21 – 2023-07-23 (×5): 3 mL via INTRAVENOUS

## 2023-07-21 MED ORDER — ACETAMINOPHEN 500 MG PO TABS
1000.0000 mg | ORAL_TABLET | Freq: Once | ORAL | Status: AC
  Administered 2023-07-21: 1000 mg via ORAL
  Filled 2023-07-21: qty 2

## 2023-07-21 MED ORDER — ONDANSETRON HCL 4 MG PO TABS
4.0000 mg | ORAL_TABLET | Freq: Four times a day (QID) | ORAL | Status: DC | PRN
Start: 2023-07-21 — End: 2023-07-23

## 2023-07-21 MED ORDER — LIDOCAINE-EPINEPHRINE (PF) 2 %-1:200000 IJ SOLN
20.0000 mL | Freq: Once | INTRAMUSCULAR | Status: AC
  Administered 2023-07-21: 20 mL
  Filled 2023-07-21: qty 20

## 2023-07-21 MED ORDER — ACETAMINOPHEN 650 MG RE SUPP: 650.0000 mg | Freq: Four times a day (QID) | RECTAL | Status: DC | PRN

## 2023-07-21 MED ORDER — ACETAMINOPHEN 325 MG PO TABS
650.0000 mg | ORAL_TABLET | Freq: Four times a day (QID) | ORAL | Status: DC | PRN
  Administered 2023-07-21 – 2023-07-22 (×3): 650 mg via ORAL
  Filled 2023-07-21 (×3): qty 2

## 2023-07-21 MED ORDER — ENOXAPARIN SODIUM 40 MG/0.4ML IJ SOSY
40.0000 mg | PREFILLED_SYRINGE | INTRAMUSCULAR | Status: DC
  Administered 2023-07-21 – 2023-07-22 (×2): 40 mg via SUBCUTANEOUS
  Filled 2023-07-21 (×2): qty 0.4

## 2023-07-21 MED ORDER — SODIUM CHLORIDE 0.9 % IV SOLN
2.0000 g | Freq: Two times a day (BID) | INTRAVENOUS | Status: DC
  Administered 2023-07-21: 2 g via INTRAVENOUS
  Filled 2023-07-21 (×2): qty 20

## 2023-07-21 MED ORDER — KETOROLAC TROMETHAMINE 15 MG/ML IJ SOLN
15.0000 mg | Freq: Four times a day (QID) | INTRAMUSCULAR | Status: DC | PRN
  Administered 2023-07-21 – 2023-07-23 (×4): 15 mg via INTRAVENOUS
  Filled 2023-07-21 (×4): qty 1

## 2023-07-21 MED ORDER — SODIUM CHLORIDE 0.9 % IV SOLN
2.0000 g | Freq: Once | INTRAVENOUS | Status: AC
  Administered 2023-07-21: 2 g via INTRAVENOUS
  Filled 2023-07-21: qty 20

## 2023-07-21 NOTE — ED Provider Notes (Signed)
 Lumbar Puncture  Date/Time: 07/21/2023 2:46 AM  Performed by: Earma Gloss, MD Authorized by: Earma Gloss, MD   Consent:    Consent obtained:  Written   Consent given by:  Patient   Risks, benefits, and alternatives were discussed: yes     Risks discussed:  Bleeding, headache, nerve damage, infection, pain and repeat procedure   Alternatives discussed:  No treatment Universal protocol:    Procedure explained and questions answered to patient or proxy's satisfaction: yes     Relevant documents present and verified: yes     Test results available: yes     Imaging studies available: yes     Required blood products, implants, devices, and special equipment available: yes     Immediately prior to procedure a time out was called: yes     Site/side marked: yes     Patient identity confirmed:  Verbally with patient Pre-procedure details:    Procedure purpose:  Diagnostic   Preparation: Patient was prepped and draped in usual sterile fashion   Anesthesia:    Anesthesia method:  Local infiltration   Local anesthetic:  Lidocaine  2% w/o epi Procedure details:    Lumbar space:  L4-L5 interspace   Patient position:  Sitting   Needle gauge:  20   Needle type:  Spinal needle - Quincke tip   Needle length (in):  2.5   Number of attempts:  1   Fluid appearance:  Clear and blood-tinged (blood in tube 2 but otherwise clear.)   Tubes of fluid:  4   Total volume (ml):  6 Post-procedure details:    Puncture site:  Adhesive bandage applied   Procedure completion:  Warden Ha, MD 07/21/23 [phone]

## 2023-07-21 NOTE — H&P (Addendum)
 History and Physical    Patient: Ashley [name] DOB: 1990-06-24 DOA: 07/20/2023 DOS: the patient was seen and examined on 07/21/2023 PCP: Patient, No Pcp Per  Patient coming from: Home  Chief Complaint:  Chief Complaint  Patient presents with   Fever   HPI: Ashley [name] is a 33 y.o. female with medical history significant of anxiety and depression not on any medications for treatment presents with a rash and fever.  The rash developed yesterday afternoon, covers the entire body, and is non-pruritic. She has not taken any medications for these symptoms at home.  In addition to the rash, she has been experiencing a sore throat for two days, along with congestion, a headache affecting the entire head, and neck stiffness. She has a mucousy cough that exacerbates the headache, and she tries to avoid coughing. No confusion is reported.  She reports diarrhea with one to two loose, liquid bowel movements per day. No nausea, vomiting, or stomach pain.  She is currently breastfeeding and has not started any new medications recently. She is not on any regular medications.  In the emergency department patient was noted to be febrile up to 102.4 F with tachycardia and tachypnea.  O2 saturations maintained on room air.  Labs significant for WBC 14.6, hemoglobin 15.6, sodium 133, glucose 141, and lactic acid 1.  Chest x-ray showed no acute abnormality.  Urine pregnancy screen negative.  Group A strep screening was negative.  Urinalysis did not show significant signs for infection.  Influenza, COVID-19, and RSV screening were negative.  CT scan of the head and cervical spine noted edematous and hyperenhancing tonsils and mucosas just above tonsillitis and pharyngitis.  Patient underwent lumbar puncture which revealed  glucose 77, RBC count 67 & 20310, white blood cell count 2 & 3.  CSF Gram stain showed no organisms present.  Blood cultures have been obtained.  Encephalitis panel was negative.   Patient had been started on broad-spectrum antibiotics including vancomycin, cefepime, and Rocephin  Review of Systems: As mentioned in the history of present illness. All other systems reviewed and are negative. Past Medical History:  Diagnosis Date   Anxiety    Depression    Past Surgical History:  Procedure Laterality Date   CYST EXCISION Left 05/28/2019   Procedure: EXCISION OF  DORSAL CYST LEFT WRIST;  Surgeon: Lyanne Sample, MD;  Location: [facility];  Service: Orthopedics;  Laterality: Left;  IV REGIONAL UPPER ARM BLOCK   laproscopic surgery for ruptured fallopian tube  2018   Social History:  reports that she has never smoked. She has never used smokeless tobacco. She reports current alcohol use. She reports that she does not use drugs.  No Known Allergies  History reviewed. No pertinent family history.  Prior to Admission medications   Not on File    Physical Exam: Vitals:   07/21/23 0630 07/21/23 0645 07/21/23 0700 07/21/23 0935  BP: 124/65 129/75 125/72   Pulse: (!) 109 (!) 114 (!) 111   Resp: 18 (!) 24 (!) 22   Temp:    99 F (37.2 C)  TempSrc:    Oral  SpO2: 99% 99% 99%   Weight:      Height:       Constitutional: Young obese female who appears ill but in no acute distress Eyes: PERRL, lids and conjunctivae normal ENMT: Mucous membranes are moist.   Normal dentition.  Neck: Mild neck stiffness noted. Respiratory: clear to auscultation bilaterally, no wheezing, no crackles.  No accessory muscle use.  Cardiovascular: Tachycardic.  No significant lower extremity swelling. Abdomen: no tenderness, no masses palpated.   Bowel sounds positive.  Musculoskeletal: no clubbing / cyanosis. No joint deformity upper and lower extremities. Good ROM, no contractures. Normal muscle tone.  Skin: Maculopapular rash present of the upper and lower extremities that does not blanch.  No mucosal involvement. Neurologic: CN 2-12 grossly intact.  Strength 5/5 in all 4.   Psychiatric: Normal judgment and insight. Alert and oriented x 3. Normal mood.    Data Reviewed:  Reviewed labs, imaging, and pertinent records as documented.  Assessment and Plan:  Sepsis secondary to tonsillitis/pharyngitis and suspected sinusitis Acute.  Patient presented with complaints of sore throat and left ear pain with findings of rash and fever.  On physical exam patient with tonsillar swelling and exudate present.  Initial fever elevated up to 103.4 F with tachycardia and tachypnea meeting SIRS criteria.  WBC elevated at 14.6 and initial lactic acid 1.  Initial strep culture negative.   CT imaging of the head neck concerning for tonsillitis and pharyngitis without drainable fluid collection and concern for marked severity left maxillary sinus and bilateral ethmoid sinus and left nasal mucosal thickening.  Blood cultures have been obtained.  Patient had been started on empiric antibiotics of vancomycin and, cefepime, and Rocephin due to concern for possible meningitis.  Lumbar puncture was performed and encephalitis panel was negative and therefore low suspicion for meningitis.  RPR and mono screens were negative.   - Admit to a telemetry bed - Follow-up blood and CSF cultures - Continue empiric antibiotics of Rocephin - Throat lozenges as needed for sore throat - Continue IV fluids - Tylenol  as needed for fever - Recheck CBC tomorrow morning  Rash Patient has maculopapular rash that does not itch and present over the upper and lower extremities that started yesterday.  Not likely related to above as patient has not been on anything for treatment. - Continue to monitor  Hyponatremia Acute.  Sodium noted to be 133.  Suspect hypovolemic hyponatremia given external losses from fever. - Continue to monitor  DVT prophylaxis: Lovenox Advance Care Planning:   Code Status: Full Code  Consults:   Family Communication: None  Severity of Illness: The appropriate patient status for  this patient is INPATIENT. Inpatient status is judged to be reasonable and necessary in order to provide the required intensity of service to ensure the patient's safety. The patient's presenting symptoms, physical exam findings, and initial radiographic and laboratory data in the context of their chronic comorbidities is felt to place them at high risk for further clinical deterioration. Furthermore, it is not anticipated that the patient will be medically stable for discharge from the hospital within 2 midnights of admission.   * I certify that at the point of admission it is my clinical judgment that the patient will require inpatient hospital care spanning beyond 2 midnights from the point of admission due to high intensity of service, high risk for further deterioration and high frequency of surveillance required.*  Author: Lena Qualia, MD 07/21/2023 9:44 AM  For on call review [email or web address].

## 2023-07-21 NOTE — ED Provider Notes (Signed)
 [place] EMERGENCY DEPARTMENT AT [facility] Provider Note   CSN: [id number] Arrival date & time: 07/20/23  2143     Patient presents with: Fever   Ashley [name] is a 33 y.o. female.  Patient with past medical history sniffer anxiety depression presents emergency department complaining of sore throat for 2 days and a rash on her chest, arms, palms, and back which was noticed this afternoon at 4 PM.  Patient states she has had fevers at home up to 101 F.  She endorses a headache, photophobia, neck stiffness.  She denies abdominal pain, nausea, vomiting, chest pain, shortness of breath.  She denies cough, nasal congestion.    Fever      Prior to Admission medications   Medication Sig Start Date End Date Taking? Authorizing Provider  cholecalciferol (VITAMIN D3) 25 MCG (1000 UNIT) tablet Take 80,000 Units by mouth daily.    [provider]  traMADol  (ULTRAM ) 50 MG tablet Take 1 tablet (50 mg total) by mouth every 6 (six) hours as needed. 05/28/19   Lyanne Sample, MD    Allergies: Patient has no known allergies.    Review of Systems  Constitutional:  Positive for fever.    Updated Vital Signs BP 106/61   Pulse (!) 110   Temp 99.1 F (37.3 C)   Resp (!) 22   Ht 5' 5 (1.651 m)   Wt 114.5 kg   LMP 07/20/2023   SpO2 97%   BMI 42.01 kg/m   Physical Exam Vitals and nursing note reviewed.  Constitutional:      General: She is not in acute distress.    Appearance: She is well-developed.  HENT:     Head: Normocephalic and atraumatic.     Mouth/Throat:     Mouth: Mucous membranes are moist.     Pharynx: Uvula midline. Posterior oropharyngeal erythema present.     Tonsils: Tonsillar exudate present. No tonsillar abscesses.   Eyes:     Conjunctiva/sclera: Conjunctivae normal.   Neck:     Meningeal: Brudzinski's sign present.   Cardiovascular:     Rate and Rhythm: Normal rate and regular rhythm.     Heart sounds: No murmur heard. Pulmonary:      Effort: Pulmonary effort is normal. No respiratory distress.     Breath sounds: Normal breath sounds.  Abdominal:     Palpations: Abdomen is soft.     Tenderness: There is no abdominal tenderness.   Musculoskeletal:        General: No swelling.     Cervical back: Neck supple.  Lymphadenopathy:     Cervical: Cervical adenopathy present.     Right cervical: Superficial cervical adenopathy present.     Left cervical: Superficial cervical adenopathy present.   Skin:    General: Skin is warm and dry.     Capillary Refill: Capillary refill takes less than 2 seconds.     Comments: Maculopapular rash over chest back bilateral arms, bilateral palms, bilateral upper thighs   Neurological:     Mental Status: She is alert.   Psychiatric:        Mood and Affect: Mood normal.     (all labs ordered are listed, but only abnormal results are displayed) Labs Reviewed  COMPREHENSIVE METABOLIC PANEL WITH GFR - Abnormal; Notable for the following components:      Result Value   Sodium 133 (*)    Glucose, Bld 141 (*)    Calcium 8.7 (*)    Albumin 3.3 (*)  All other components within normal limits  CBC WITH DIFFERENTIAL/PLATELET - Abnormal; Notable for the following components:   WBC 14.6 (*)    RBC 5.42 (*)    Hemoglobin 15.6 (*)    HCT 47.2 (*)    Neutro Abs 12.6 (*)    All other components within normal limits  URINALYSIS, W/ REFLEX TO CULTURE (INFECTION SUSPECTED) - Abnormal; Notable for the following components:   APPearance HAZY (*)    Hgb urine dipstick MODERATE (*)    Ketones, ur 5 (*)    Protein, ur 30 (*)    Leukocytes,Ua TRACE (*)    All other components within normal limits  CSF CELL COUNT WITH DIFFERENTIAL - Abnormal; Notable for the following components:   Color, CSF PINK (*)    Appearance, CSF HAZY (*)    RBC Count, CSF 2,310 (*)    All other components within normal limits  CSF CELL COUNT WITH DIFFERENTIAL - Abnormal; Notable for the following components:   RBC  Count, CSF 67 (*)    All other components within normal limits  GLUCOSE, CSF - Abnormal; Notable for the following components:   Glucose, CSF 77 (*)    All other components within normal limits  GROUP A STREP BY PCR  RESP PANEL BY RT-PCR (RSV, FLU A&B, COVID)  RVPGX2  CSF CULTURE W GRAM STAIN  CULTURE, BLOOD (ROUTINE X 2)  CULTURE, BLOOD (ROUTINE X 2)  PROTIME-INR  HCG, SERUM, QUALITATIVE  HIV ANTIBODY (ROUTINE TESTING W REFLEX)  PROTEIN, CSF  CRYPTOCOCCAL ANTIGEN, CSF  MENINGITIS/ENCEPHALITIS PANEL (CSF)  MONONUCLEOSIS SCREEN  RPR  I-STAT CG4 LACTIC ACID, ED    EKG: EKG Interpretation Date/Time:  Thursday July 20 2023 22:49:28 EDT Ventricular Rate:  130 PR Interval:  135 QRS Duration:  98 QT Interval:  270 QTC Calculation: 397 R Axis:   75  Text Interpretation: Sinus tachycardia No significant change was found Confirmed by Earma Gloss [phone]) on 07/21/2023 12:59:42 AM  Radiology: CT Soft Tissue Neck W Contrast Result Date: 07/21/2023 CLINICAL DATA:  PTA rule out EXAM: CT NECK WITH CONTRAST TECHNIQUE: Multidetector CT imaging of the neck was performed using the standard protocol following the bolus administration of intravenous contrast. RADIATION DOSE REDUCTION: This exam was performed according to the departmental dose-optimization program which includes automated exposure control, adjustment of the mA and/or kV according to patient size and/or use of iterative reconstruction technique. CONTRAST:  75mL OMNIPAQUE IOHEXOL 350 MG/ML SOLN COMPARISON:  None Available. FINDINGS: Pharynx and larynx: Edematous and hyperenhancing tonsils and mucosa, suggestive of tonsillitis and pharyngitis. No discrete, drainable fluid collection. The nasal cavity is filled with fluid and there is fluid layering in the posterior nasopharynx. Salivary glands: No inflammation, mass, or stone. Thyroid : Normal. Lymph nodes: Bilateral upper cervical chain adenopathy. Vascular: No well assessed due to non  arterial timing. Major arteries appear patent. Limited intracranial: Negative. Visualized orbits: Negative. Mastoids and visualized paranasal sinuses: Moderate paranasal sinus mucosal thickening. No mastoid effusions. Skeleton: No acute abnormality on limited assessment. Upper chest: Visualized lung apices are clear. IMPRESSION: 1. Edematous and hyperenhancing tonsils and mucosa, suggestive of tonsillitis and pharyngitis. Surrounding edema including small volume prevertebral edema without discrete, drainable fluid collection. 2. Fluid fills the nasal cavity and layers in the posterior nasopharynx. 3. Bilateral upper cervical chain adenopathy, nonspecific but likely reactive given the above findings. Electronically Signed   By: Stevenson Elbe M.D.   On: 07/21/2023 00:59   CT Head Wo Contrast Result Date: 07/21/2023 CLINICAL  DATA:  Headache and fever. EXAM: CT HEAD WITHOUT CONTRAST TECHNIQUE: Contiguous axial images were obtained from the base of the skull through the vertex without intravenous contrast. RADIATION DOSE REDUCTION: This exam was performed according to the departmental dose-optimization program which includes automated exposure control, adjustment of the mA and/or kV according to patient size and/or use of iterative reconstruction technique. COMPARISON:  None Available. FINDINGS: Brain: No evidence of acute infarction, hemorrhage, hydrocephalus, extra-axial collection or mass lesion/mass effect. Vascular: No hyperdense vessel or unexpected calcification. Skull: Normal. Negative for fracture or focal lesion. Sinuses/Orbits: Marked severity left maxillary sinus, bilateral ethmoid sinus and left nasal mucosal thickening is noted. Other: None. IMPRESSION: 1. No acute intracranial abnormality. 2. Marked severity left maxillary sinus, bilateral ethmoid sinus and left nasal mucosal thickening. Electronically Signed   By: Virgle Grime M.D.   On: 07/21/2023 00:38   DG Chest Port 1 View Result Date:  07/20/2023 CLINICAL DATA:  Possible sepsis EXAM: PORTABLE CHEST 1 VIEW COMPARISON:  None Available. FINDINGS: The heart size and mediastinal contours are within normal limits. Both lungs are clear. The visualized skeletal structures are unremarkable. IMPRESSION: No active disease. Electronically Signed   By: Esmeralda Hedge M.D.   On: 07/20/2023 22:12     .Critical Care  Performed by: Elisa Guest, PA-C Authorized by: Elisa Guest, PA-C   Critical care provider statement:    Critical care time (minutes):  75   Critical care time was exclusive of:  Separately billable procedures and treating other patients   Critical care was necessary to treat or prevent imminent or life-threatening deterioration of the following conditions:  Sepsis   Critical care was time spent personally by me on the following activities:  Development of treatment plan with patient or surrogate, discussions with consultants, evaluation of patient's response to treatment, examination of patient, ordering and review of laboratory studies, ordering and review of radiographic studies, ordering and performing treatments and interventions, pulse oximetry, re-evaluation of patient's condition and review of old charts   Care discussed with: admitting provider      Medications Ordered in the ED  lactated ringers  infusion ( Intravenous New Bag/Given 07/21/23 0133)  cefTRIAXone (ROCEPHIN) 2 g in sodium chloride 0.9 % 100 mL IVPB (has no administration in time range)  ceFEPIme (MAXIPIME) 2 g in sodium chloride 0.9 % 100 mL IVPB (0 g Intravenous Stopped 07/21/23 0008)  metroNIDAZOLE (FLAGYL) IVPB 500 mg (0 mg Intravenous Stopped 07/21/23 0108)  lactated ringers  bolus 1,000 mL (0 mLs Intravenous Stopped 07/20/23 2357)    And  lactated ringers  bolus 1,000 mL (0 mLs Intravenous Stopped 07/21/23 0133)    And  lactated ringers  bolus 1,000 mL (0 mLs Intravenous Stopped 07/21/23 0303)  vancomycin (VANCOREADY) IVPB 2000 mg/400 mL (0 mg  Intravenous Stopped 07/21/23 0328)  iohexol (OMNIPAQUE) 350 MG/ML injection 75 mL (75 mLs Intravenous Contrast Given 07/21/23 0029)  lidocaine -EPINEPHrine (XYLOCAINE  W/EPI) 2 %-1:200000 (PF) injection 20 mL (20 mLs Infiltration Given by Other 07/21/23 0242)  acetaminophen  (TYLENOL ) tablet 1,000 mg (1,000 mg Oral Given 07/21/23 0336)                                    Medical Decision Making Amount and/or Complexity of Data Reviewed Labs: ordered. Radiology: ordered.  Risk OTC drugs. Prescription drug management.   This patient presents to the ED for concern of sore throat and rash, this involves an extensive  number of treatment options, and is a complaint that carries with it a high risk of complications and morbidity.  The differential diagnosis includes strep pharyngitis, mononucleosis, meningococcemia, secondary syphilis, others   Co morbidities / Chronic conditions that complicate the patient evaluation  None   Additional history obtained:  Additional history obtained from EMR and family   Lab Tests:  I Ordered, and personally interpreted labs.  The pertinent results include: Leukocytosis with a white count of 14,600, negative strep panel, UA not showing infection, negative respiratory panel   Imaging Studies ordered:  I ordered imaging studies including CT head without contrast, CT soft tissue neck with contrast, chest x-ray I independently visualized and interpreted imaging which showed no acute findings on chest x-ray or head CT.  Head CT showing sinusitis.   1. Edematous and hyperenhancing tonsils and mucosa, suggestive of  tonsillitis and pharyngitis. Surrounding edema including small  volume prevertebral edema without discrete, drainable fluid  collection.  2. Fluid fills the nasal cavity and layers in the posterior  nasopharynx.  3. Bilateral upper cervical chain adenopathy, nonspecific but likely  reactive given the above findings.    I agree with the  radiologist interpretation   Cardiac Monitoring: / EKG:  The patient was maintained on a cardiac monitor.  I personally viewed and interpreted the cardiac monitored which showed an underlying rhythm of: Sinus tachycardia   Problem List / ED Course / Critical interventions / Medication management   I ordered medication including cefepime, vancomycin, Flagyl, LR boluses, Tylenol  Reevaluation of the patient after these medicines showed that the patient stayed the same I have reviewed the patients home medicines and have made adjustments as needed   Consultations Obtained:  I requested consultation with the hospitalist, Dr. [facility] Lavender and discussed lab and imaging findings as well as pertinent plan - they recommend: Admit   Social Determinants of Health:  Patient has no primary care provider   Test / Admission - Considered:  Patient meeting sepsis criteria upon arrival.  Code sepsis activated from the triage area and broad-spectrum antibiotics initiated.  Rapid strep testing was negative.  Mono testing negative.  LP performed by my attending not showing signs of meningitis at this time.  RPR pending.  Unclear cause of patient's rash combined with strep throat at this time.  Patient to be admitted to hospitalist for further management, treatment of sepsis of unknown etiology initiated.      Final diagnoses:  Sepsis, due to unspecified organism, unspecified whether acute organ dysfunction present [facility])  Sore throat  Rash    ED Discharge Orders     None          Delories Fetter 07/21/23 0515    Earma Gloss, MD 07/21/23 1530

## 2023-07-21 NOTE — ED Notes (Signed)
 Pt ambulated to bathroom independently

## 2023-07-21 NOTE — ED Notes (Signed)
 CCMD called.

## 2023-07-21 NOTE — ED Notes (Signed)
 Provider made aware of pt heart rate.

## 2023-07-22 DIAGNOSIS — J039 Acute tonsillitis, unspecified: Secondary | ICD-10-CM | POA: Diagnosis not present

## 2023-07-22 DIAGNOSIS — R509 Fever, unspecified: Secondary | ICD-10-CM

## 2023-07-22 DIAGNOSIS — J019 Acute sinusitis, unspecified: Secondary | ICD-10-CM | POA: Diagnosis not present

## 2023-07-22 DIAGNOSIS — A419 Sepsis, unspecified organism: Secondary | ICD-10-CM | POA: Diagnosis not present

## 2023-07-22 DIAGNOSIS — R21 Rash and other nonspecific skin eruption: Secondary | ICD-10-CM | POA: Diagnosis not present

## 2023-07-22 LAB — DIFFERENTIAL
Abs Immature Granulocytes: 0.07 10*3/uL (ref 0.00–0.07)
Basophils Absolute: 0.1 10*3/uL (ref 0.0–0.1)
Basophils Relative: 0 %
Eosinophils Absolute: 0.1 10*3/uL (ref 0.0–0.5)
Eosinophils Relative: 1 %
Immature Granulocytes: 1 %
Lymphocytes Relative: 5 %
Lymphs Abs: 0.6 10*3/uL — ABNORMAL LOW (ref 0.7–4.0)
Monocytes Absolute: 0.5 10*3/uL (ref 0.1–1.0)
Monocytes Relative: 4 %
Neutro Abs: 11.8 10*3/uL — ABNORMAL HIGH (ref 1.7–7.7)
Neutrophils Relative %: 89 %

## 2023-07-22 LAB — BASIC METABOLIC PANEL WITH GFR
Anion gap: 7 (ref 5–15)
BUN: <5 mg/dL — ABNORMAL LOW (ref 6–20)
CO2: 24 mmol/L (ref 22–32)
Calcium: 7.9 mg/dL — ABNORMAL LOW (ref 8.9–10.3)
Chloride: 103 mmol/L (ref 98–111)
Creatinine, Ser: 0.65 mg/dL (ref 0.44–1.00)
GFR, Estimated: >60 mL/min (ref >60)
Glucose, Bld: 119 mg/dL — ABNORMAL HIGH (ref 70–99)
Potassium: 3.6 mmol/L (ref 3.5–5.1)
Sodium: 134 mmol/L — ABNORMAL LOW (ref 135–145)

## 2023-07-22 LAB — CBC
HCT: 36.2 % (ref 36.0–46.0)
Hemoglobin: 12.5 g/dL (ref 12.0–15.0)
MCH: 29.6 pg (ref 26.0–34.0)
MCHC: 34.5 g/dL (ref 30.0–36.0)
MCV: 85.6 fL (ref 80.0–100.0)
Platelets: 169 10*3/uL (ref 150–400)
RBC: 4.23 MIL/uL (ref 3.87–5.11)
RDW: 13.2 % (ref 11.5–15.5)
WBC: 13.2 10*3/uL — ABNORMAL HIGH (ref 4.0–10.5)
nRBC: 0.2 % (ref 0.0–0.2)

## 2023-07-22 LAB — MONONUCLEOSIS SCREEN: Mono Screen: NEGATIVE

## 2023-07-22 MED ORDER — ACETAMINOPHEN 650 MG RE SUPP: 650.0000 mg | Freq: Four times a day (QID) | RECTAL | Status: DC | PRN

## 2023-07-22 MED ORDER — ACETAMINOPHEN 500 MG PO TABS
1000.0000 mg | ORAL_TABLET | Freq: Four times a day (QID) | ORAL | Status: DC | PRN
  Administered 2023-07-22 – 2023-07-23 (×3): 1000 mg via ORAL
  Filled 2023-07-22 (×3): qty 2

## 2023-07-22 MED ORDER — PHENOL 1.4 % MT LIQD: 2.0000 | OROMUCOSAL | Status: DC | PRN

## 2023-07-22 MED ORDER — SODIUM CHLORIDE 0.9 % IV SOLN
2.0000 g | INTRAVENOUS | Status: DC
  Administered 2023-07-22: 2 g via INTRAVENOUS
  Filled 2023-07-22: qty 20

## 2023-07-22 MED ORDER — SODIUM CHLORIDE 0.9 % IV SOLN: INTRAVENOUS | Status: DC

## 2023-07-22 NOTE — Plan of Care (Signed)

## 2023-07-22 NOTE — Assessment & Plan Note (Signed)
-   Presumed dehydration from poor intake - Treated with fluids on admission

## 2023-07-22 NOTE — Assessment & Plan Note (Signed)
-   Continue cold liquids as needed -Cepacol and Chloraseptic spray

## 2023-07-22 NOTE — Hospital Course (Signed)
 Ashley [name] is a 33 y.o. female with medical history significant of anxiety and depression not on any medications for treatment presents with a rash and fever.  She states that she has had cold-like symptoms since approximately June 5.  Symptoms had not improved and began getting worse the week prior to admission.  On approximately Thursday she began developing a rash along with more green mucus production.  The rash involves most of her body including palms and feet.  Due to ongoing fever with associated headache, she underwent LP while in the ER.  Testing was negative for meningitis and encephalitis/meningitis panel also negative.  She was admitted for further workup.

## 2023-07-22 NOTE — Consult Note (Signed)
 [facility]    Date of Admission:  07/20/2023   Total days of inpatient antibiotics 2        Reason for Consult: Fever    Principal Problem:   Sepsis (HCC) Active Problems:   Hyponatremia   Tonsillitis   Sinusitis   Rash   Assessment: 33 year old female who is breast-feeding status post resection 2024, anxiety/depression on sertraline presents for fever/rash.  ID engaged as LP was benign. #Post viral rash patient #fever - Patient developed rash per day prior to admission.  She is helping having sore throat congestion, headache and exercise 2 days. -She underwent LP with 2 WBC, 28 protein, 77 glucose, 67 RBCs, cultures negative.  ME panel negative. - CT head have shown marked severity of left maxillary sinus, bilateral ethmoid sinus and nasal thickening.  CT soft tissue neck showed edematous and hyperenhancing tonsils suggestive tonsillitis and pharyngitis.  No drainable fluid collection. - She was initially started on empiric antibiotics for meningitis then de-escalate to ceftriaxone . - ID engaged.  Rash in confluent. She notes that starting  around June 4 th shse had a cough. She noted her symptoms eventually resolved. Then about 3 days later on Tuesday started having fevers then developed rash (no pruritic/not painful). She has a sone about 64 months old who was on abx about 3 days ago.  No travel recently outside [place] . Immunized with childhood vaccines. Stay at home mom.  -monospot negative Recommendations:  -Follow blood cultures, csf cx - D/C ctx. Start augmentin tomorrow to complete 5 days of abx given possible sinusitis - I suspect post  viral syndrome. I discussed etiology with pt and family at bedside  Evaluation of this patient requires complex antimicrobial therapy evaluation and counseling + isolation needs for disease transmission risk assessment and mitigation   Microbiology:   Antibiotics: Vancomycin , metronidazole  and cefepime   on 6/18 - Ceftriaxone  6/21-present  Cultures: Blood 6/19 pending Urine  Other 6/19 pending  HPI: Ashley [name] is a 33 y.o. female postpartum status post C-section on 09/09/2022 breast-feeding, anxiety/depression on sertraline presents for rash and fever.  Patient stated that rash developed day prior to admission, her entire body is nonpruritic.  She had been experiencing third-floor x 2 days along with congestion, headache and neck stiffness.  On arrival she had a temp of 102.4 diffuse papular rash.  Chest x-ray negative group A strep negative COVID/influenza/RSV, RVP 20 panel negative.  CT scan head showed that edematous and hypoenhancing tonsils and mucus just with tonsillitis and pharyngitis.  LP with 2 WBC, Gram stain no organisms.  Patient is on ceftriaxone .  Meningitis panel negative.  ID engaged. Review of Systems: Review of Systems  All other systems reviewed and are negative.   Past Medical History:  Diagnosis Date   Anxiety    Depression     Social History   Tobacco Use   Smoking status: Never   Smokeless tobacco: Never  Substance Use Topics   Alcohol use: Yes    Comment: occas   Drug use: Never    History reviewed. No pertinent family history. Scheduled Meds:  enoxaparin  (LOVENOX ) injection  40 mg Subcutaneous Q24H   guaiFENesin   600 mg Oral BID   sodium chloride  flush  3 mL Intravenous Q12H   Continuous Infusions:  cefTRIAXone  (ROCEPHIN )  IV     PRN Meds:.acetaminophen  **OR** [DISCONTINUED] acetaminophen , ketorolac , menthol -cetylpyridinium, morphine  injection, ondansetron  **OR** ondansetron  (ZOFRAN ) IV, phenol No Known Allergies  OBJECTIVE:  Blood pressure 109/65, pulse (!) 120, temperature (!) 100.7 F (38.2 C), resp. rate 20, height 5' 5 (1.651 m), weight 114.5 kg, last menstrual period 07/20/2023, SpO2 100%.  Physical Exam Constitutional:      Appearance: Normal appearance.  HENT:     Head: Normocephalic and atraumatic.     Right Ear: Tympanic  membrane normal.     Left Ear: Tympanic membrane normal.     Nose: Nose normal.     Mouth/Throat:     Mouth: Mucous membranes are moist.   Eyes:     Extraocular Movements: Extraocular movements intact.     Conjunctiva/sclera: Conjunctivae normal.     Pupils: Pupils are equal, round, and reactive to light.    Cardiovascular:     Rate and Rhythm: Normal rate and regular rhythm.     Heart sounds: No murmur heard.    No friction rub. No gallop.  Pulmonary:     Effort: Pulmonary effort is normal.     Breath sounds: Normal breath sounds.  Abdominal:     General: Abdomen is flat.     Palpations: Abdomen is soft.   Musculoskeletal:        General: Normal range of motion.   Skin:    Comments: Papular rash throughout   Neurological:     General: No focal deficit present.     Mental Status: She is alert and oriented to person, place, and time.   Psychiatric:        Mood and Affect: Mood normal.     Lab Results Lab Results  Component Value Date   WBC 13.2 (H) 07/22/2023   HGB 12.5 07/22/2023   HCT 36.2 07/22/2023   MCV 85.6 07/22/2023   PLT 169 07/22/2023    Lab Results  Component Value Date   CREATININE 0.65 07/22/2023   BUN <5 (L) 07/22/2023   NA 134 (L) 07/22/2023   K 3.6 07/22/2023   CL 103 07/22/2023   CO2 24 07/22/2023    Lab Results  Component Value Date   ALT 37 07/20/2023   AST 22 07/20/2023   ALKPHOS 62 07/20/2023   BILITOT 0.8 07/20/2023       Loney Stank, MD [facility] 07/22/2023, 10:39 AM

## 2023-07-22 NOTE — Assessment & Plan Note (Signed)
-   Continue Tylenol  and ibuprofen for fever, pains, headache

## 2023-07-22 NOTE — Progress Notes (Signed)
 Progress Note    Ashley [name]   FMW:[id number]  DOB: 1990-07-26  DOA: 07/20/2023     1 PCP: Patient, No Pcp Per  Initial CC: fever, rash, HA  Hospital Course: Ashley [name] is a 33 y.o. female with medical history significant of anxiety and depression not on any medications for treatment presents with a rash and fever.  She states that she has had cold-like symptoms since approximately June 5.  Symptoms had not improved and began getting worse the week prior to admission.  On approximately Thursday she began developing a rash along with more green mucus production.  The rash involves most of her body including palms and feet.  Due to ongoing fever with associated headache, she underwent LP while in the ER.  Testing was negative for meningitis and encephalitis/meningitis panel also negative.  She was admitted for further workup.  Interval History:  Resting in bed when seen this morning.  Covered in rash but denies any pain or itching.  Mostly has ongoing fevers, sore throat, and occasional headache.  Assessment and Plan: * Sepsis (HCC) - fever, tachycardia, tachypnea, leukocytosis. Suspected viral etiology with her diffuse rash. CT notable for tonsillitis and pharyngitis; marked thickening of left maxillary sinus, left nasal, bilateral ethmoid sinus - LP negative for meningitis - remains on rocephin ; will ask for ID input  Sinusitis - Continue Tylenol  and Toradol  for fever, pains, headache  Tonsillitis - Continue cold liquids as needed -Cepacol and Chloraseptic spray  Rash - diffuse blanching nonpuritic nonpainful maculopapular rash involving palms and feet - suspect this is viral related; other considered differential includes transmitted diseases (syphilis, mono, HIV) - she denies any sexual activity for approx 1 year - Monoscreen and HIV screening negative -RPR also negative -Follow-up repeat monoscreen - Check GC chlamydia -Follow-up EBV antibody - supportive care  otherwise for now; no topical treatment needed at this time  Hyponatremia - Presumed dehydration from poor intake - Treated with fluids on admission   Old records reviewed in assessment of this patient  Antimicrobials: Rocephin  07/21/2023 >> 07/22/2023 Cefepime  07/20/2023 x 1 Flagyl  07/20/2023 x 1 Vancomycin  07/21/2023 x 1  DVT prophylaxis:  enoxaparin  (LOVENOX ) injection 40 mg Start: 07/21/23 2200   Code Status:   Code Status: Full Code  Mobility Assessment (Last 72 Hours)     Mobility Assessment     Row Name 07/21/23 1940 07/21/23 1754 07/21/23 1724       Does patient have an order for bedrest or is patient medically unstable No - Continue assessment No - Continue assessment No - Continue assessment     What is the highest level of mobility based on the progressive mobility assessment? Level 6 (Walks independently in room and hall) - Balance while walking in room without assist - Complete Level 6 (Walks independently in room and hall) - Balance while walking in room without assist - Complete Level 6 (Walks independently in room and hall) - Balance while walking in room without assist - Complete        Barriers to discharge: none Disposition Plan:  Home HH orders placed: n/a Status is: Inpt  Objective: Blood pressure 109/65, pulse (!) 120, temperature (!) 100.7 F (38.2 C), resp. rate 20, height 5' 5 (1.651 m), weight 114.5 kg, last menstrual period 07/20/2023, SpO2 100%.  Examination:  Physical Exam Constitutional:      Appearance: Normal appearance.     Comments: Congested sounding  HENT:     Head: Normocephalic and atraumatic.  Nose:     Comments: Whitish-green mucus appreciated    Mouth/Throat:     Mouth: Mucous membranes are moist.   Eyes:     Extraocular Movements: Extraocular movements intact.    Cardiovascular:     Rate and Rhythm: Normal rate and regular rhythm.  Pulmonary:     Effort: Pulmonary effort is normal. No respiratory distress.      Breath sounds: Normal breath sounds. No wheezing.  Abdominal:     General: Bowel sounds are normal. There is no distension.     Palpations: Abdomen is soft.     Tenderness: There is no abdominal tenderness.   Musculoskeletal:        General: Normal range of motion.     Cervical back: Normal range of motion and neck supple.   Skin:    Comments: Diffuse maculopapular blanching rash involving entire trunk and extremities.  See picture below   Neurological:     General: No focal deficit present.     Mental Status: She is alert.   Psychiatric:        Mood and Affect: Mood normal.   Pic taken 6/21:        Consultants:  ID  Procedures:  none  Data Reviewed: Results for orders placed or performed during the hospital encounter of 07/20/23 (from the past 24 hours)  CBC     Status: Abnormal   Collection Time: 07/22/23  6:00 AM  Result Value Ref Range   WBC 13.2 (H) 4.0 - 10.5 K/uL   RBC 4.23 3.87 - 5.11 MIL/uL   Hemoglobin 12.5 12.0 - 15.0 g/dL   HCT 63.7 63.9 - 53.9 %   MCV 85.6 80.0 - 100.0 fL   MCH 29.6 26.0 - 34.0 pg   MCHC 34.5 30.0 - 36.0 g/dL   RDW 86.7 88.4 - 84.4 %   Platelets 169 150 - 400 K/uL   nRBC 0.2 0.0 - 0.2 %  Basic metabolic panel     Status: Abnormal   Collection Time: 07/22/23  6:00 AM  Result Value Ref Range   Sodium 134 (L) 135 - 145 mmol/L   Potassium 3.6 3.5 - 5.1 mmol/L   Chloride 103 98 - 111 mmol/L   CO2 24 22 - 32 mmol/L   Glucose, Bld 119 (H) 70 - 99 mg/dL   BUN <5 (L) 6 - 20 mg/dL   Creatinine, Ser 9.34 0.44 - 1.00 mg/dL   Calcium 7.9 (L) 8.9 - 10.3 mg/dL   GFR, Estimated >39 >39 mL/min   Anion gap 7 5 - 15  Differential     Status: Abnormal   Collection Time: 07/22/23  6:00 AM  Result Value Ref Range   Neutrophils Relative % 89 %   Neutro Abs 11.8 (H) 1.7 - 7.7 K/uL   Lymphocytes Relative 5 %   Lymphs Abs 0.6 (L) 0.7 - 4.0 K/uL   Monocytes Relative 4 %   Monocytes Absolute 0.5 0.1 - 1.0 K/uL   Eosinophils Relative 1 %    Eosinophils Absolute 0.1 0.0 - 0.5 K/uL   Basophils Relative 0 %   Basophils Absolute 0.1 0.0 - 0.1 K/uL   Immature Granulocytes 1 %   Abs Immature Granulocytes 0.07 0.00 - 0.07 K/uL    I have reviewed pertinent nursing notes, vitals, labs, and images as necessary. I have ordered labwork to follow up on as indicated.  I have reviewed the last notes from staff over past 24 hours. I have discussed patient's  care plan and test results with nursing staff, CM/SW, and other staff as appropriate.  Time spent: Greater than 50% of the 55 minute visit was spent in counseling/coordination of care for the patient as laid out in the A&P.   LOS: 1 day   Alm Apo, MD [facility] 07/22/2023, 12:04 PM

## 2023-07-22 NOTE — Assessment & Plan Note (Signed)
-   fever, tachycardia, tachypnea, leukocytosis. Suspected viral etiology with her diffuse rash. CT notable for tonsillitis and pharyngitis; marked thickening of left maxillary sinus, left nasal, bilateral ethmoid sinus - LP negative for meningitis - remains on rocephin ; will ask for ID input

## 2023-07-22 NOTE — Assessment & Plan Note (Addendum)
-   diffuse blanching nonpuritic nonpainful maculopapular rash involving palms and feet - suspect this is viral related; other considered differential includes transmitted diseases (syphilis, mono, HIV) - she denies any sexual activity for approx 1 year - Monoscreen and HIV screening negative -RPR also negative -Follow-up repeat monoscreen - Check GC chlamydia -Follow-up EBV antibody - supportive care otherwise for now; no topical treatment needed at this time

## 2023-07-22 NOTE — Plan of Care (Signed)

## 2023-07-23 ENCOUNTER — Encounter (HOSPITAL_COMMUNITY): Payer: Self-pay | Admitting: Internal Medicine

## 2023-07-23 DIAGNOSIS — J039 Acute tonsillitis, unspecified: Secondary | ICD-10-CM | POA: Diagnosis not present

## 2023-07-23 DIAGNOSIS — A419 Sepsis, unspecified organism: Secondary | ICD-10-CM | POA: Diagnosis not present

## 2023-07-23 DIAGNOSIS — J019 Acute sinusitis, unspecified: Secondary | ICD-10-CM | POA: Diagnosis not present

## 2023-07-23 DIAGNOSIS — R21 Rash and other nonspecific skin eruption: Secondary | ICD-10-CM | POA: Diagnosis not present

## 2023-07-23 LAB — BASIC METABOLIC PANEL WITH GFR
Anion gap: 6 (ref 5–15)
BUN: <5 mg/dL — ABNORMAL LOW (ref 6–20)
CO2: 23 mmol/L (ref 22–32)
Calcium: 7.7 mg/dL — ABNORMAL LOW (ref 8.9–10.3)
Chloride: 105 mmol/L (ref 98–111)
Creatinine, Ser: 0.67 mg/dL (ref 0.44–1.00)
GFR, Estimated: >60 mL/min (ref >60)
Glucose, Bld: 115 mg/dL — ABNORMAL HIGH (ref 70–99)
Potassium: 3.9 mmol/L (ref 3.5–5.1)
Sodium: 134 mmol/L — ABNORMAL LOW (ref 135–145)

## 2023-07-23 LAB — CBC WITH DIFFERENTIAL/PLATELET
Abs Immature Granulocytes: 0.05 10*3/uL (ref 0.00–0.07)
Basophils Absolute: 0 10*3/uL (ref 0.0–0.1)
Basophils Relative: 0 %
Eosinophils Absolute: 0.3 10*3/uL (ref 0.0–0.5)
Eosinophils Relative: 3 %
HCT: 37.4 % (ref 36.0–46.0)
Hemoglobin: 12.4 g/dL (ref 12.0–15.0)
Immature Granulocytes: 1 %
Lymphocytes Relative: 8 %
Lymphs Abs: 0.7 10*3/uL (ref 0.7–4.0)
MCH: 28.8 pg (ref 26.0–34.0)
MCHC: 33.2 g/dL (ref 30.0–36.0)
MCV: 86.8 fL (ref 80.0–100.0)
Monocytes Absolute: 0.5 10*3/uL (ref 0.1–1.0)
Monocytes Relative: 5 %
Neutro Abs: 7.4 10*3/uL (ref 1.7–7.7)
Neutrophils Relative %: 83 %
Platelets: 170 10*3/uL (ref 150–400)
RBC: 4.31 MIL/uL (ref 3.87–5.11)
RDW: 13.3 % (ref 11.5–15.5)
WBC: 9 10*3/uL (ref 4.0–10.5)
nRBC: 0 % (ref 0.0–0.2)

## 2023-07-23 LAB — MAGNESIUM: Magnesium: 2 mg/dL (ref 1.7–2.4)

## 2023-07-23 MED ORDER — PHENOL 1.4 % MT LIQD: 1.0000 | OROMUCOSAL | Status: DC | PRN

## 2023-07-23 MED ORDER — PHENOL 1.4 % MT LIQD
2.0000 | OROMUCOSAL | Status: DC | PRN
  Filled 2023-07-23: qty 177

## 2023-07-23 MED ORDER — AMOXICILLIN-POT CLAVULANATE 875-125 MG PO TABS: 1.0000 | ORAL_TABLET | Freq: Two times a day (BID) | ORAL | 0 refills | Status: AC

## 2023-07-23 MED ORDER — ACETAMINOPHEN 500 MG PO TABS
1000.0000 mg | ORAL_TABLET | Freq: Four times a day (QID) | ORAL | Status: DC | PRN
Start: 2023-07-23 — End: 2023-08-12

## 2023-07-23 MED ORDER — AMOXICILLIN-POT CLAVULANATE 875-125 MG PO TABS
1.0000 | ORAL_TABLET | Freq: Two times a day (BID) | ORAL | Status: DC
  Administered 2023-07-23: 1 via ORAL
  Filled 2023-07-23: qty 1

## 2023-07-23 MED ORDER — IBUPROFEN 200 MG PO TABS: 400.0000 mg | ORAL_TABLET | Freq: Three times a day (TID) | ORAL | Status: DC | PRN

## 2023-07-23 NOTE — Discharge Instructions (Signed)
 Confirmed with pharmacist. Augmentin is safe for you to continue with breast feeding, also knowing your son is on amoxicillin as well.

## 2023-07-23 NOTE — Plan of Care (Signed)

## 2023-07-23 NOTE — Progress Notes (Addendum)
 DISCHARGE NOTE HOME Ashley [name] to be discharged Home per MD order. Discussed prescriptions and follow up appointments with the patient. Prescriptions given to patient; medication list explained in detail. Patient verbalized understanding.  Skin clean, dry and intact without evidence of skin break down, no evidence of skin tears noted. IV catheter discontinued intact. Site without signs and symptoms of complications. Dressing and pressure applied. Pt denies pain at the site currently. No complaints noted.  Patient free of lines, drains, and wounds.   An After Visit Summary (AVS) was printed and given to the patient. Patient will be escorted via wheelchair, and discharged home via private auto when ready to discharge  Ashley SHAUNNA Pepper, RN

## 2023-07-23 NOTE — Discharge Summary (Addendum)
 Physician Discharge Summary   Ashley [name] FMW:[id number] DOB: 09/29/1990 DOA: 07/20/2023  PCP: Patient, No Pcp Per  Admit date: 07/20/2023 Discharge date: 07/23/2023  Admitted From: Home Disposition:  Home Discharging physician: Alm Apo, MD Barriers to discharge: none  Discharge Condition: stable CODE STATUS: Full  Diet recommendation:  Diet Orders (From admission, onward)     Start     Ordered   07/23/23 0000  Diet general        07/23/23 1022   07/21/23 0727  Diet regular Room service appropriate? Yes; Fluid consistency: Thin  Diet effective now       Question Answer Comment  Room service appropriate? Yes   Fluid consistency: Thin      07/21/23 0726            Hospital Course: Ashley [name] is a 33 y.o. female with medical history significant of anxiety and depression not on any medications for treatment presents with a rash and fever.  She states that she has had cold-like symptoms since approximately June 5.  Symptoms had not improved and began getting worse the week prior to admission.  On approximately Thursday she began developing a rash along with more green mucus production.  The rash involves most of her body including palms and feet.  Due to ongoing fever with associated headache, she underwent LP while in the ER.  Testing was negative for meningitis and encephalitis/meningitis panel also negative.  She was admitted for further workup.  Assessment and Plan: * Sepsis (HCC)-resolved as of 07/23/2023 - fever, tachycardia, tachypnea, leukocytosis. Suspected viral etiology with her diffuse rash. CT notable for tonsillitis and pharyngitis; marked thickening of left maxillary sinus, left nasal, bilateral ethmoid sinus - LP negative for meningitis - appreciate ID consult; agree with likely viral. Will plan for 5 days augmentin at discharge; confirmed with pharmacist (okay for breastfeeding; her son is also on amox at home for an ear infection  too)  Sinusitis - Continue Tylenol  and ibuprofen for fever, pains, headache  Tonsillitis - Continue cold liquids as needed -Cepacol and Chloraseptic spray  Rash - diffuse blanching nonpuritic nonpainful maculopapular rash involving palms and feet - suspect this is viral related; other considered differential includes transmitted diseases (syphilis, mono, HIV) - she denies any sexual activity for approx 1 year - Monoscreen and HIV screening negative -RPR also negative -Follow-up repeat monoscreen = negative again -Follow-up EBV antibody; pending at discharge - supportive care otherwise for now; no topical treatment needed at this time  Hyponatremia - Presumed dehydration from poor intake - Treated with fluids on admission   The patient's acute and chronic medical conditions were treated accordingly. On day of discharge, patient was felt deemed stable for discharge. Patient/family member advised to call PCP or come back to ER if needed.   Principal Diagnosis: Sepsis [facility])  Discharge Diagnoses: Active Hospital Problems   Diagnosis Date Noted   Tonsillitis 07/21/2023    Priority: 1.   Sinusitis 07/21/2023    Priority: 1.   Rash 07/21/2023    Priority: 2.   Hyponatremia 07/21/2023    Priority: 3.    Resolved Hospital Problems   Diagnosis Date Noted Date Resolved   Sepsis [facility]) 07/21/2023 07/23/2023    Priority: 1.     Discharge Instructions     Diet general   Complete by: As directed    Increase activity slowly   Complete by: As directed       Allergies as of 07/23/2023   No Known  Allergies      Medication List     TAKE these medications    acetaminophen  500 MG tablet Commonly known as: TYLENOL  Take 2 tablets (1,000 mg total) by mouth every 6 (six) hours as needed for mild pain (pain score 1-3), fever, moderate pain (pain score 4-6) or headache (or Fever >/= 101).   amoxicillin-clavulanate 875-125 MG tablet Commonly known as: AUGMENTIN Take 1 tablet by  mouth every 12 (twelve) hours for 5 days.   ibuprofen 200 MG tablet Commonly known as: ADVIL Take 2-3 tablets (400-600 mg total) by mouth every 8 (eight) hours as needed for fever, headache or mild pain (pain score 1-3).        No Known Allergies  Consultations: ID  Procedures:   Discharge Exam: BP 133/80 (BP Location: Right Arm)   Pulse (!) 113   Temp 98.3 F (36.8 C)   Resp 20   Ht 5' 5 (1.651 m)   Wt 114.5 kg   LMP 07/20/2023   SpO2 97%   BMI 42.01 kg/m  Physical Exam Constitutional:      Appearance: Normal appearance.     Comments: Congested sounding  HENT:     Head: Normocephalic and atraumatic.     Nose:     Comments: Whitish-green mucus appreciated    Mouth/Throat:     Mouth: Mucous membranes are moist.     Pharynx: Pharyngeal swelling and posterior oropharyngeal erythema present.   Eyes:     Extraocular Movements: Extraocular movements intact.    Cardiovascular:     Rate and Rhythm: Normal rate and regular rhythm.  Pulmonary:     Effort: Pulmonary effort is normal. No respiratory distress.     Breath sounds: Normal breath sounds. No wheezing.  Abdominal:     General: Bowel sounds are normal. There is no distension.     Palpations: Abdomen is soft.     Tenderness: There is no abdominal tenderness.   Musculoskeletal:        General: Normal range of motion.     Cervical back: Normal range of motion and neck supple.   Skin:    Comments: Diffuse maculopapular blanching rash involving entire trunk and extremities.    Neurological:     General: No focal deficit present.     Mental Status: She is alert.   Psychiatric:        Mood and Affect: Mood normal.      The results of significant diagnostics from this hospitalization (including imaging, microbiology, ancillary and laboratory) are listed below for reference.   Microbiology: Recent Results (from the past 240 hours)  Group A Strep by PCR     Status: None   Collection Time: 07/20/23  9:57  PM   Specimen: Throat; Sterile Swab  Result Value Ref Range Status   Group A Strep by PCR NOT DETECTED NOT DETECTED Final    Comment: Performed at [facility], [address] [place], [place] [address]  Resp panel by RT-PCR (RSV, Flu A&B, Covid) Anterior Nasal Swab     Status: None   Collection Time: 07/20/23  9:57 PM   Specimen: Anterior Nasal Swab  Result Value Ref Range Status   SARS Coronavirus 2 by RT PCR NEGATIVE NEGATIVE Final   Influenza A by PCR NEGATIVE NEGATIVE Final   Influenza B by PCR NEGATIVE NEGATIVE Final    Comment: (NOTE) The Xpert Xpress SARS-CoV-2/FLU/RSV plus assay is intended as an aid in the diagnosis of influenza from Nasopharyngeal  swab specimens and should not be used as a sole basis for treatment. Nasal washings and aspirates are unacceptable for Xpert Xpress SARS-CoV-2/FLU/RSV testing.  Fact Sheet for Patients: BloggerCourse.com  Fact Sheet for Healthcare Providers: SeriousBroker.it  This test is not yet approved or cleared by the [place]  FDA and has been authorized for detection and/or diagnosis of SARS-CoV-2 by FDA under an Emergency Use Authorization (EUA). This EUA will remain in effect (meaning this test can be used) for the duration of the COVID-19 declaration under Section 564(b)(1) of the Act, 21 U.S.C. section 360bbb-3(b)(1), unless the authorization is terminated or revoked.     Resp Syncytial Virus by PCR NEGATIVE NEGATIVE Final    Comment: (NOTE) Fact Sheet for Patients: BloggerCourse.com  Fact Sheet for Healthcare Providers: SeriousBroker.it  This test is not yet approved or cleared by the [place]  FDA and has been authorized for detection and/or diagnosis of SARS-CoV-2 by FDA under an Emergency Use Authorization (EUA). This EUA will remain in effect (meaning this test can be used) for the duration of the COVID-19  declaration under Section 564(b)(1) of the Act, 21 U.S.C. section 360bbb-3(b)(1), unless the authorization is terminated or revoked.  Performed at [facility], [address] [place], [place] [address]   Blood Culture (routine x 2)     Status: None (Preliminary result)   Collection Time: 07/20/23 10:07 PM   Specimen: BLOOD RIGHT ARM  Result Value Ref Range Status   Specimen Description BLOOD RIGHT ARM  Final   Special Requests   Final    BOTTLES DRAWN AEROBIC AND ANAEROBIC Blood Culture adequate volume   Culture   Final    NO GROWTH 3 DAYS Performed at [facility], [address], [place], [place] [address]    Report Status PENDING  Incomplete  Blood Culture (routine x 2)     Status: None (Preliminary result)   Collection Time: 07/20/23 10:29 PM   Specimen: BLOOD  Result Value Ref Range Status   Specimen Description BLOOD RIGHT ANTECUBITAL  Final   Special Requests   Final    BOTTLES DRAWN AEROBIC AND ANAEROBIC Blood Culture adequate volume   Culture   Final    NO GROWTH 3 DAYS Performed at [facility], [address] [place], [place] [address]    Report Status PENDING  Incomplete  CSF culture     Status: None (Preliminary result)   Collection Time: 07/21/23 12:04 AM   Specimen: CSF; Cerebrospinal Fluid  Result Value Ref Range Status   Specimen Description CSF BACK  Final   Special Requests NONE  Final   Gram Stain   Final    WBC PRESENT, PREDOMINANTLY PMN NO ORGANISMS SEEN CYTOSPIN SMEAR    Culture   Final    NO GROWTH 2 DAYS Performed at [facility], [address], [place], [place] [address]    Report Status PENDING  Incomplete     Labs: BNP (last 3 results) No results for input(s): BNP in the last 8760 hours. Basic Metabolic Panel: Recent Labs  Lab 07/20/23 2207 07/22/23 0600 07/23/23 0430  NA 133* 134* 134*  K 4.3 3.6 3.9  CL 101 103 105  CO2 22 24 23   GLUCOSE 141* 119* 115*  BUN 6 <5* <5*  CREATININE 0.76 0.65 0.67   CALCIUM 8.7* 7.9* 7.7*  MG  --   --  2.0   Liver Function Tests: Recent Labs  Lab 07/20/23 2207  AST  22  ALT 37  ALKPHOS 62  BILITOT 0.8  PROT 7.5  ALBUMIN 3.3*   No results for input(s): LIPASE, AMYLASE in the last 168 hours. No results for input(s): AMMONIA in the last 168 hours. CBC: Recent Labs  Lab 07/20/23 2207 07/22/23 0600 07/23/23 0430  WBC 14.6* 13.2* 9.0  NEUTROABS 12.6* 11.8* 7.4  HGB 15.6* 12.5 12.4  HCT 47.2* 36.2 37.4  MCV 87.1 85.6 86.8  PLT 238 169 170   Cardiac Enzymes: No results for input(s): CKTOTAL, CKMB, CKMBINDEX, TROPONINI in the last 168 hours. BNP: Invalid input(s): POCBNP CBG: No results for input(s): GLUCAP in the last 168 hours. D-Dimer No results for input(s): DDIMER in the last 72 hours. Hgb A1c No results for input(s): HGBA1C in the last 72 hours. Lipid Profile No results for input(s): CHOL, HDL, LDLCALC, TRIG, CHOLHDL, LDLDIRECT in the last 72 hours. Thyroid  function studies No results for input(s): TSH, T4TOTAL, T3FREE, THYROIDAB in the last 72 hours.  Invalid input(s): FREET3 Anemia work up No results for input(s): VITAMINB12, FOLATE, FERRITIN, TIBC, IRON, RETICCTPCT in the last 72 hours. Urinalysis    Component Value Date/Time   COLORURINE YELLOW 07/21/2023 0139   APPEARANCEUR HAZY (A) 07/21/2023 0139   LABSPEC 1.024 07/21/2023 0139   PHURINE 7.0 07/21/2023 0139   GLUCOSEU NEGATIVE 07/21/2023 0139   HGBUR MODERATE (A) 07/21/2023 0139   BILIRUBINUR NEGATIVE 07/21/2023 0139   KETONESUR 5 (A) 07/21/2023 0139   PROTEINUR 30 (A) 07/21/2023 0139   NITRITE NEGATIVE 07/21/2023 0139   LEUKOCYTESUR TRACE (A) 07/21/2023 0139   Sepsis Labs Recent Labs  Lab 07/20/23 2207 07/22/23 0600 07/23/23 0430  WBC 14.6* 13.2* 9.0   Microbiology Recent Results (from the past 240 hours)  Group A Strep by PCR     Status: None   Collection Time: 07/20/23  9:57 PM   Specimen:  Throat; Sterile Swab  Result Value Ref Range Status   Group A Strep by PCR NOT DETECTED NOT DETECTED Final    Comment: Performed at [facility], [address] [place], [place] [address]  Resp panel by RT-PCR (RSV, Flu A&B, Covid) Anterior Nasal Swab     Status: None   Collection Time: 07/20/23  9:57 PM   Specimen: Anterior Nasal Swab  Result Value Ref Range Status   SARS Coronavirus 2 by RT PCR NEGATIVE NEGATIVE Final   Influenza A by PCR NEGATIVE NEGATIVE Final   Influenza B by PCR NEGATIVE NEGATIVE Final    Comment: (NOTE) The Xpert Xpress SARS-CoV-2/FLU/RSV plus assay is intended as an aid in the diagnosis of influenza from Nasopharyngeal swab specimens and should not be used as a sole basis for treatment. Nasal washings and aspirates are unacceptable for Xpert Xpress SARS-CoV-2/FLU/RSV testing.  Fact Sheet for Patients: BloggerCourse.com  Fact Sheet for Healthcare Providers: SeriousBroker.it  This test is not yet approved or cleared by the [place]  FDA and has been authorized for detection and/or diagnosis of SARS-CoV-2 by FDA under an Emergency Use Authorization (EUA). This EUA will remain in effect (meaning this test can be used) for the duration of the COVID-19 declaration under Section 564(b)(1) of the Act, 21 U.S.C. section 360bbb-3(b)(1), unless the authorization is terminated or revoked.     Resp Syncytial Virus by PCR NEGATIVE NEGATIVE Final    Comment: (NOTE) Fact Sheet for Patients: BloggerCourse.com  Fact Sheet for Healthcare Providers: SeriousBroker.it  This test is not yet approved or cleared by the [place]  FDA and has been  authorized for detection and/or diagnosis of SARS-CoV-2 by FDA under an Emergency Use Authorization (EUA). This EUA will remain in effect (meaning this test can be used) for the duration of the COVID-19 declaration  under Section 564(b)(1) of the Act, 21 U.S.C. section 360bbb-3(b)(1), unless the authorization is terminated or revoked.  Performed at [facility], [address] [place], [place] [address]   Blood Culture (routine x 2)     Status: None (Preliminary result)   Collection Time: 07/20/23 10:07 PM   Specimen: BLOOD RIGHT ARM  Result Value Ref Range Status   Specimen Description BLOOD RIGHT ARM  Final   Special Requests   Final    BOTTLES DRAWN AEROBIC AND ANAEROBIC Blood Culture adequate volume   Culture   Final    NO GROWTH 3 DAYS Performed at [facility], [address], [place], [place] [address]    Report Status PENDING  Incomplete  Blood Culture (routine x 2)     Status: None (Preliminary result)   Collection Time: 07/20/23 10:29 PM   Specimen: BLOOD  Result Value Ref Range Status   Specimen Description BLOOD RIGHT ANTECUBITAL  Final   Special Requests   Final    BOTTLES DRAWN AEROBIC AND ANAEROBIC Blood Culture adequate volume   Culture   Final    NO GROWTH 3 DAYS Performed at [facility], [address] [place], [place] [address]    Report Status PENDING  Incomplete  CSF culture     Status: None (Preliminary result)   Collection Time: 07/21/23 12:04 AM   Specimen: CSF; Cerebrospinal Fluid  Result Value Ref Range Status   Specimen Description CSF BACK  Final   Special Requests NONE  Final   Gram Stain   Final    WBC PRESENT, PREDOMINANTLY PMN NO ORGANISMS SEEN CYTOSPIN SMEAR    Culture   Final    NO GROWTH 2 DAYS Performed at [facility], [address], [place], [place] [address]    Report Status PENDING  Incomplete    Procedures/Studies: CT Soft Tissue Neck W Contrast Result Date: 07/21/2023 CLINICAL DATA:  PTA rule out EXAM: CT NECK WITH CONTRAST TECHNIQUE: Multidetector CT imaging of the neck was performed using the standard protocol following the bolus administration of intravenous contrast. RADIATION DOSE REDUCTION: This exam  was performed according to the departmental dose-optimization program which includes automated exposure control, adjustment of the mA and/or kV according to patient size and/or use of iterative reconstruction technique. CONTRAST:  75mL OMNIPAQUE  IOHEXOL  350 MG/ML SOLN COMPARISON:  None Available. FINDINGS: Pharynx and larynx: Edematous and hyperenhancing tonsils and mucosa, suggestive of tonsillitis and pharyngitis. No discrete, drainable fluid collection. The nasal cavity is filled with fluid and there is fluid layering in the posterior nasopharynx. Salivary glands: No inflammation, mass, or stone. Thyroid : Normal. Lymph nodes: Bilateral upper cervical chain adenopathy. Vascular: No well assessed due to non arterial timing. Major arteries appear patent. Limited intracranial: Negative. Visualized orbits: Negative. Mastoids and visualized paranasal sinuses: Moderate paranasal sinus mucosal thickening. No mastoid effusions. Skeleton: No acute abnormality on limited assessment. Upper chest: Visualized lung apices are clear. IMPRESSION: 1. Edematous and hyperenhancing tonsils and mucosa, suggestive of tonsillitis and pharyngitis. Surrounding edema including small volume prevertebral edema without discrete, drainable fluid collection. 2. Fluid fills the nasal cavity and layers in the posterior nasopharynx. 3. Bilateral upper cervical chain adenopathy, nonspecific but likely reactive given the above findings. Electronically Signed   By: Gilmore  GORMAN Molt M.D.   On: 07/21/2023 00:59   CT Head Wo Contrast Result Date: 07/21/2023 CLINICAL DATA:  Headache and fever. EXAM: CT HEAD WITHOUT CONTRAST TECHNIQUE: Contiguous axial images were obtained from the base of the skull through the vertex without intravenous contrast. RADIATION DOSE REDUCTION: This exam was performed according to the departmental dose-optimization program which includes automated exposure control, adjustment of the mA and/or kV according to patient size  and/or use of iterative reconstruction technique. COMPARISON:  None Available. FINDINGS: Brain: No evidence of acute infarction, hemorrhage, hydrocephalus, extra-axial collection or mass lesion/mass effect. Vascular: No hyperdense vessel or unexpected calcification. Skull: Normal. Negative for fracture or focal lesion. Sinuses/Orbits: Marked severity left maxillary sinus, bilateral ethmoid sinus and left nasal mucosal thickening is noted. Other: None. IMPRESSION: 1. No acute intracranial abnormality. 2. Marked severity left maxillary sinus, bilateral ethmoid sinus and left nasal mucosal thickening. Electronically Signed   By: Suzen Dials M.D.   On: 07/21/2023 00:38   DG Chest Port 1 View Result Date: 07/20/2023 CLINICAL DATA:  Possible sepsis EXAM: PORTABLE CHEST 1 VIEW COMPARISON:  None Available. FINDINGS: The heart size and mediastinal contours are within normal limits. Both lungs are clear. The visualized skeletal structures are unremarkable. IMPRESSION: No active disease. Electronically Signed   By: Luke Bun M.D.   On: 07/20/2023 22:12     Time coordinating discharge: Over 30 minutes    Alm Apo, MD  [facility] 07/23/2023, 10:26 AM

## 2023-07-23 NOTE — Progress Notes (Signed)
 Patient taken out by wheel chair to discharge to private auto

## 2023-07-24 LAB — CSF CULTURE W GRAM STAIN: Culture: NO GROWTH

## 2023-07-24 LAB — EBV AB TO VIRAL CAPSID AG PNL, IGG+IGM
EBV VCA IgG: >600 U/mL — ABNORMAL HIGH (ref 0.0–17.9)
EBV VCA IgM: <36 U/mL (ref 0.0–35.9)

## 2023-07-25 LAB — CULTURE, BLOOD (ROUTINE X 2)
Culture: NO GROWTH
Culture: NO GROWTH
Special Requests: ADEQUATE
Special Requests: ADEQUATE

## 2023-07-26 LAB — CSF CELL COUNT WITH DIFFERENTIAL
RBC Count, CSF: 2310 /mm3 — ABNORMAL HIGH
Tube #: 1

## 2023-07-28 ENCOUNTER — Ambulatory Visit
Admission: RE | Admit: 2023-07-28 | Discharge: 2023-07-28 | Disposition: A | Discharge status: Home / Self Care | Payer: Self-pay | Source: Ambulatory Visit | Attending: Physician Assistant | Admitting: Physician Assistant

## 2023-07-28 VITALS — BP 100/78 | HR 138 | Temp 98.1°F | Resp 18

## 2023-07-28 DIAGNOSIS — R21 Rash and other nonspecific skin eruption: Secondary | ICD-10-CM | POA: Diagnosis not present

## 2023-07-28 DIAGNOSIS — Z09 Encounter for follow-up examination after completed treatment for conditions other than malignant neoplasm: Secondary | ICD-10-CM

## 2023-07-28 DIAGNOSIS — J039 Acute tonsillitis, unspecified: Secondary | ICD-10-CM

## 2023-07-28 DIAGNOSIS — R000 Tachycardia, unspecified: Secondary | ICD-10-CM

## 2023-07-28 NOTE — Discharge Instructions (Addendum)
 As we discussed, the safest thing to do is to start the doxycycline while we are waiting for the test results.  If you develop any recurrent fevers or rash please go to the emergency room immediately as we discussed.  Monitor your heart rate at home and follow-up with cardiology soon as possible.  Avoid caffeine and other stimulants.  Finish your Augmentin  as prescribed.  Follow-up with primary care as scheduled.  If you have any changing or worsening symptoms you need to go to the emergency room immediately as we discussed.  I will contact you if any of your lab work is abnormal.

## 2023-07-28 NOTE — ED Triage Notes (Addendum)
 Patient to Urgent Care for a follow-up after her hospitalization for sepsis/ sinusitis/ tonsillitis/ rash 6/19.   Completing prescribed Augmentin  today.  Reports continued nasal congestion and sinus pain/ muffled hearing in her left ear. No fevers but has been having hot flashes and sweating. Improvement in rashes.

## 2023-07-28 NOTE — ED Provider Notes (Cosign Needed Addendum)
 CAY RALPH PELT    CSN: [id number] Arrival date & time: 07/28/23  9057      History   Chief Complaint Chief Complaint  Patient presents with   Follow-up    Entered by patient    HPI Ashley [name] is a 33 y.o. female.   Patient presents today for follow-up.  She was recently hospitalized from 07/20/2023 to 07/23/2023 for sepsis.  She reports that in the beginning of June several family members were sick with URI symptoms but they recovered and she did not.  She then developed a confluent rash that is noted to have involve her palms and soles of feet, fever, sinus congestion, sore throat.  She was negative for mono and strep in the ER but was admitted due to concern for sepsis.  Lumbar puncture did not show any evidence of meningitis and encephalitis screening was negative.  Chest x-ray was normal.  CT head was concerning for sinusitis and CT neck showed cervical adenopathy with tonsillar enlargement.  She denies any significant past medical history.  Reports that since her hospitalization she has completed course of Augmentin  and does report improvement of her sore throat to the point that she is not able to swallow.  She does report ongoing congestion but this has also improved.  She is not taking any over-the-counter medication.  She does report intermittent night sweats and extreme fatigue but denies any ongoing fevers.  She is confident that she is not pregnant but she is actively breast-feeding.  She denies any known tick bites.    Past Medical History:  Diagnosis Date   Anxiety    Depression     Patient Active Problem List   Diagnosis Date Noted   Hyponatremia 07/21/2023   Tonsillitis 07/21/2023   Sinusitis 07/21/2023   Rash 07/21/2023    Past Surgical History:  Procedure Laterality Date   CYST EXCISION Left 05/28/2019   Procedure: EXCISION OF  DORSAL CYST LEFT WRIST;  Surgeon: Murrell Kuba, MD;  Location: [facility];  Service: Orthopedics;  Laterality:  Left;  IV REGIONAL UPPER ARM BLOCK   laproscopic surgery for ruptured fallopian tube  2018    OB History   No obstetric history on file.      Home Medications    Prior to Admission medications   Medication Sig Start Date End Date Taking? Authorizing Provider  acetaminophen  (TYLENOL ) 500 MG tablet Take 2 tablets (1,000 mg total) by mouth every 6 (six) hours as needed for mild pain (pain score 1-3), fever, moderate pain (pain score 4-6) or headache (or Fever >/= 101). 07/23/23   Patsy Lenis, MD  amoxicillin -clavulanate (AUGMENTIN ) 875-125 MG tablet Take 1 tablet by mouth every 12 (twelve) hours for 5 days. 07/23/23 07/28/23  Patsy Lenis, MD  ibuprofen  (ADVIL ) 200 MG tablet Take 2-3 tablets (400-600 mg total) by mouth every 8 (eight) hours as needed for fever, headache or mild pain (pain score 1-3). 07/23/23   Patsy Lenis, MD    Family History History reviewed. No pertinent family history.  Social History Social History   Tobacco Use   Smoking status: Never   Smokeless tobacco: Never  Substance Use Topics   Alcohol use: Yes    Comment: occas   Drug use: Never     Allergies   Patient has no known allergies.   Review of Systems Review of Systems  Constitutional:  Positive for activity change and fatigue. Negative for appetite change and fever.  HENT:  Positive for congestion  and sore throat. Negative for sinus pressure and sneezing.   Respiratory:  Negative for cough and shortness of breath.   Cardiovascular:  Negative for chest pain.  Gastrointestinal:  Negative for abdominal pain, diarrhea, nausea and vomiting.  Skin:  Positive for rash (improving).     Physical Exam Triage Vital Signs ED Triage Vitals  Encounter Vitals Group     BP 07/28/23 0959 100/78     Girls Systolic BP Percentile --      Girls Diastolic BP Percentile --      Boys Systolic BP Percentile --      Boys Diastolic BP Percentile --      Pulse Rate 07/28/23 0959 (!) 138     Resp 07/28/23  0959 18     Temp 07/28/23 0959 98.1 F (36.7 C)     Temp src --      SpO2 07/28/23 0959 98 %     Weight --      Height --      Head Circumference --      Peak Flow --      Pain Score 07/28/23 1006 5     Pain Loc --      Pain Education --      Exclude from Growth Chart --    No data found.  Updated Vital Signs BP 100/78   Pulse (!) 138   Temp 98.1 F (36.7 C)   Resp 18   LMP 07/20/2023   SpO2 98%   Visual Acuity Right Eye Distance:   Left Eye Distance:   Bilateral Distance:    Right Eye Near:   Left Eye Near:    Bilateral Near:     Physical Exam Vitals reviewed.  Constitutional:      General: She is awake. She is not in acute distress.    Appearance: Normal appearance. She is well-developed. She is not ill-appearing.     Comments: Very pleasant female appears stated age in no acute distress sitting comfortably in exam room  HENT:     Head: Normocephalic and atraumatic.     Right Ear: Tympanic membrane, ear canal and external ear normal. Tympanic membrane is not erythematous or bulging.     Left Ear: Tympanic membrane, ear canal and external ear normal. Tympanic membrane is not erythematous or bulging.     Nose:     Right Sinus: No maxillary sinus tenderness or frontal sinus tenderness.     Left Sinus: No maxillary sinus tenderness or frontal sinus tenderness.     Mouth/Throat:     Pharynx: Uvula midline. Posterior oropharyngeal erythema present. No oropharyngeal exudate.     Tonsils: Tonsillar exudate present. No tonsillar abscesses. 2+ on the right. 2+ on the left.   Cardiovascular:     Rate and Rhythm: Regular rhythm. Tachycardia present.     Heart sounds: No murmur heard. Pulmonary:     Effort: Pulmonary effort is normal.     Breath sounds: Normal breath sounds. No wheezing, rhonchi or rales.     Comments: Clear to auscultation bilaterally  Musculoskeletal:     Right lower leg: No edema.     Left lower leg: No edema.   Skin:    General: Skin is moist.      Comments: Maculopapular rash noted upper extremities reportedly improved per patient.   Psychiatric:        Behavior: Behavior is cooperative.      UC Treatments / Results  Labs (all labs ordered are  listed, but only abnormal results are displayed) Labs Reviewed  COMPREHENSIVE METABOLIC PANEL WITH GFR  CBC WITH DIFFERENTIAL/PLATELET  TSH  T4, FREE  SPOTTED FEVER GROUP ANTIBODIES    EKG   Radiology No results found.  Procedures Procedures (including critical care time)  Medications Ordered in UC Medications - No data to display  Initial Impression / Assessment and Plan / UC Course  I have reviewed the triage vital signs and the nursing notes.  Pertinent labs & imaging results that were available during my care of the patient were reviewed by me and considered in my medical decision making (see chart for details).     Patient is tachycardic but otherwise well-appearing, afebrile, nontoxic.  EKG was obtained given tachycardia that showed sinus tachycardia with ventricular rate of 135 bpm without ischemic changes; compared to 07/20/2023 tracing no significant change.  Patient reports that overall she is feeling much better and her rash has improved.  We did discuss that given her constellation of symptoms it is possible that she could have a tickborne illness such as [facility] spotted fever.  Low suspicion for Lyme disease as she has already been taking the appropriate treatment with high-dose amoxicillin .  We did discuss that typical recommendation is to start doxycycline even before lab work is obtained, however, she is inclusively breast-feeding and after we discussed the risks and benefits of breast-feeding with doxycycline she declined to initiate this medication until she knows if she has [facility] spotted fever.  She reports that she is improving and does not think it is necessary to start the medication given concern that this medication would pass to infant through  breastmilk.  Will obtain blood work for RMSF as well as additional testing for thyroid , CBC, CMP.  We did discuss that she should have a low threshold for going to the emergency room if her symptoms are improving or if anything worsens.  She was scheduled a follow-up appointment with primary care 08/30/2023 and strongly encouraged to keep this appointment.  We discussed that if her symptoms or not improving but also not worsening she should return here in a week.  If all of her testing is negative she is to follow-up with cardiology for evaluation of her tachycardia and was given the contact information for local provider with instruction to call to schedule an appointment.  We discussed at length the importance of going to the ER with any changing or worsening symptoms.  All questions were answered to patient satisfaction.  She expressed understanding and agreement with treatment plan.  Final Clinical Impressions(s) / UC Diagnoses   Final diagnoses:  Tachycardia  Rash  Acute tonsillitis, unspecified etiology  Hospital discharge follow-up     Discharge Instructions      As we discussed, the safest thing to do is to start the doxycycline while we are waiting for the test results.  If you develop any recurrent fevers or rash please go to the emergency room immediately as we discussed.  Monitor your heart rate at home and follow-up with cardiology soon as possible.  Avoid caffeine and other stimulants.  Finish your Augmentin  as prescribed.  Follow-up with primary care as scheduled.  If you have any changing or worsening symptoms you need to go to the emergency room immediately as we discussed.  I will contact you if any of your lab work is abnormal.     ED Prescriptions   None    PDMP not reviewed this encounter.   Tayva Easterday,  Rocky POUR, PA-C 07/28/23 1048    Gailya Tauer K, PA-C 07/28/23 1212

## 2023-07-30 ENCOUNTER — Telehealth: Admitting: Family

## 2023-07-30 NOTE — Progress Notes (Signed)
 Discussed that we can not do referrals through virtual visits. Recommend follow up with PCP. She does not have an appointment to establish care until end of July. She was seen in the Urgent Care yesterday who recommended follow up with Cardiologists. Recommend they contact their office to see if they can do referral or call Cardiologists office to make an appointment.

## 2023-07-30 NOTE — Patient Instructions (Signed)
Sinus Tachycardia  Sinus tachycardia is a fast heartbeat. In sinus tachycardia, the heart beats more than 100 times a minute. Sinus tachycardia starts in the part of the heart called the sinoatrial (SA) node. Sinus tachycardia may be harmless, or it may be a sign of a serious condition. What are the causes? This condition may be caused by: Exercise or exertion. A fever. Pain. Loss of body fluids (dehydration). Severe bleeding (hemorrhage). Anxiety and stress. Certain substances, including: Alcohol. Caffeine. Tobacco and nicotine products. Cold medicines. Illegal drugs. Medical conditions including: Heart disease. An infection. An overactive thyroid (hyperthyroidism). A lack of red blood cells (anemia). What are the signs or symptoms? Symptoms of this condition include: A feeling that the heart is beating fast or unevenly (palpitations). Suddenly noticing your heartbeat (cardiac awareness). Lightheadedness. Tiredness (fatigue). Shortness of breath. Chest pain. Nausea. Fainting. How is this diagnosed? This condition is diagnosed with: A physical exam. Tests or monitoring, such as: Blood tests. An electrocardiogram (ECG). This test measures the electrical activity of the heart. Ambulatory cardiac monitor. This records your heartbeats for 24 hours or more. You may be referred to a heart specialist (cardiologist). How is this treated? Treatment for this condition depends on the cause. Treatment may involve: Treating the underlying condition. Taking new medicines or changing your current medicines as told by your health care provider. Making changes to your diet or lifestyle. Follow these instructions at home: Lifestyle  Do not use any products that contain nicotine or tobacco. These products include cigarettes, chewing tobacco, and vaping devices, such as e-cigarettes. If you need help quitting, ask your health care provider. Do not use illegal drugs, such as  cocaine. Learn relaxation methods to help you when you get stressed or anxious. These include deep breathing. Avoid caffeine or other stimulants, including herbal stimulants that are found in energy drinks. Alcohol use  Do not drink alcohol if: Your health care provider tells you not to drink. You are pregnant, may be pregnant, or are planning to become pregnant. If you drink alcohol: Limit how much you have to: 0-1 drink a day for women. 0-2 drinks a day for men. Know how much alcohol is in your drink. In the [place] one drink equals one 12 oz bottle of beer (355 mL), one 5 oz glass of wine (148 mL), or one 1 oz glass of hard liquor (44 mL). General instructions Drink enough fluids to keep your urine pale yellow. Take over-the-counter and prescription medicines only as told by your health care provider. Ask your health care provider about taking vitamins, herbs, and supplements. Contact a health care provider if: You have vomiting or diarrhea that does not go away. You have a fever. You have weakness or dizziness. You feel faint. Get help right away if: You have pain in your chest, upper arms, jaw, or neck. You have palpitations that do not go away. Summary In sinus tachycardia, the heart beats more than 100 times a minute. Sinus tachycardia may be harmless, or it may be a sign of a serious condition. Treatment for this condition depends on the cause or the underlying condition. Get help right away if you have pain in your chest, upper arms, jaw, or neck. This information is not intended to replace advice given to you by your health care provider. Make sure you discuss any questions you have with your health care provider. Document Revised: 05/18/2021 Document Reviewed: 05/18/2021 Elsevier Patient Education  2024 Elsevier Inc.  

## 2023-08-03 ENCOUNTER — Encounter: Payer: Self-pay | Admitting: Family Medicine

## 2023-08-03 ENCOUNTER — Ambulatory Visit (INDEPENDENT_AMBULATORY_CARE_PROVIDER_SITE_OTHER): Admitting: Family Medicine

## 2023-08-03 ENCOUNTER — Ambulatory Visit (HOSPITAL_COMMUNITY): Payer: Self-pay | Admitting: Physician Assistant

## 2023-08-03 VITALS — BP 120/71 | HR 115 | Temp 98.6°F | Resp 15 | Ht 65.0 in | Wt 257.3 lb

## 2023-08-03 DIAGNOSIS — R000 Tachycardia, unspecified: Secondary | ICD-10-CM | POA: Diagnosis not present

## 2023-08-03 DIAGNOSIS — R5383 Other fatigue: Secondary | ICD-10-CM

## 2023-08-03 DIAGNOSIS — R4589 Other symptoms and signs involving emotional state: Secondary | ICD-10-CM | POA: Insufficient documentation

## 2023-08-03 DIAGNOSIS — R7401 Elevation of levels of liver transaminase levels: Secondary | ICD-10-CM

## 2023-08-03 DIAGNOSIS — D751 Secondary polycythemia: Secondary | ICD-10-CM | POA: Diagnosis not present

## 2023-08-03 DIAGNOSIS — Z7689 Persons encountering health services in other specified circumstances: Secondary | ICD-10-CM

## 2023-08-03 LAB — COMPREHENSIVE METABOLIC PANEL WITH GFR
ALT: 89 IU/L — ABNORMAL HIGH (ref 0–32)
AST: 52 IU/L — ABNORMAL HIGH (ref 0–40)
Albumin: 4.2 g/dL (ref 3.9–4.9)
Alkaline Phosphatase: 117 IU/L (ref 44–121)
BUN/Creatinine Ratio: 16 (ref 9–23)
BUN: 14 mg/dL (ref 6–20)
Bilirubin Total: 0.4 mg/dL (ref 0.0–1.2)
CO2: 20 mmol/L (ref 20–29)
Calcium: 9.4 mg/dL (ref 8.7–10.2)
Chloride: 100 mmol/L (ref 96–106)
Creatinine, Ser: 0.87 mg/dL (ref 0.57–1.00)
Globulin, Total: 4 g/dL (ref 1.5–4.5)
Glucose: 149 mg/dL — ABNORMAL HIGH (ref 70–99)
Potassium: 4.8 mmol/L (ref 3.5–5.2)
Sodium: 137 mmol/L (ref 134–144)
Total Protein: 8.2 g/dL (ref 6.0–8.5)
eGFR: 90 mL/min/{1.73_m2} (ref >59)

## 2023-08-03 LAB — CBC WITH DIFFERENTIAL/PLATELET
Basophils Absolute: 0.2 10*3/uL (ref 0.0–0.2)
Basos: 1 %
EOS (ABSOLUTE): 0.3 10*3/uL (ref 0.0–0.4)
Eos: 3 %
Hematocrit: 50.3 % — ABNORMAL HIGH (ref 34.0–46.6)
Hemoglobin: 16.3 g/dL — ABNORMAL HIGH (ref 11.1–15.9)
Immature Grans (Abs): 0.4 10*3/uL — ABNORMAL HIGH (ref 0.0–0.1)
Immature Granulocytes: 4 %
Lymphocytes Absolute: 2.5 10*3/uL (ref 0.7–3.1)
Lymphs: 24 %
MCH: 29.1 pg (ref 26.6–33.0)
MCHC: 32.4 g/dL (ref 31.5–35.7)
MCV: 90 fL (ref 79–97)
Monocytes Absolute: 0.7 10*3/uL (ref 0.1–0.9)
Monocytes: 6 %
Neutrophils Absolute: 6.6 10*3/uL (ref 1.4–7.0)
Neutrophils: 62 %
Platelets: 441 10*3/uL (ref 150–450)
RBC: 5.61 x10E6/uL — ABNORMAL HIGH (ref 3.77–5.28)
RDW: 13 % (ref 11.7–15.4)
WBC: 10.6 10*3/uL (ref 3.4–10.8)

## 2023-08-03 LAB — TSH: TSH: 1.45 u[IU]/mL (ref 0.450–4.500)

## 2023-08-03 LAB — SPOTTED FEVER GROUP ANTIBODIES
Spotted Fever Group IgG: <1:64 {titer}
Spotted Fever Group IgM: <1:64 {titer}

## 2023-08-03 LAB — T4, FREE: Free T4: 1.54 ng/dL (ref 0.82–1.77)

## 2023-08-03 NOTE — Progress Notes (Signed)
 New Patient Office Visit  Subjective   Patient ID: Ashley [name], female    DOB: 07-15-1990  Age: 33 y.o. MRN: [id number]  CC:  Chief Complaint  Patient presents with   Establish Care    Hospitalized 2 weeks ago [place] Tachycardia and Rash continued still today.   HPI Ashley [name] is a 33 year old female who presents to establish with [facility] at [facility].   CC: Patient here to establish care  Last PCP: [facility] at [place], saw once and then moved Specialists: OBGYN PMHx: anxiety, depression, CIN2 PSx: LEEP   Patient most recently admitted to [facility] from 6/19-6/22- she went there for a rash with sore throat x2 days with congestion, headache and neck stiffness. She also reported diarrhea and fever. Due to 102.64F with tachycardia and tachypnea with WBC 14.6, she was admitted for sepsis protocol. She went to various testing, including lumbar puncture, UA, CBC, and blood cultures. She reports her HR is still high and she is still having a full body rash that is now itchy (was not previously).  She reports her symptoms initially started on 6/4 with a sinus cold, lethargic, tired. No cough/sinus issues.   Inpatient ID informed her that she was nursing her son, who had an ear infection and was most likely r/t ear infection. She was started on broad spectrum IV abx.   L ear- cannot hear out of   CBC- elevated  Glucose 149 AST 52 & ALT 89 HR: highest has been high 130s  Outpatient Encounter Medications as of 08/03/2023  Medication Sig   acetaminophen  (TYLENOL ) 500 MG tablet Take 2 tablets (1,000 mg total) by mouth every 6 (six) hours as needed for mild pain (pain score 1-3), fever, moderate pain (pain score 4-6) or headache (or Fever >/= 101). (Patient not taking: Reported on 08/03/2023)   ibuprofen  (ADVIL ) 200 MG tablet Take 2-3 tablets (400-600 mg total) by mouth every 8 (eight) hours as needed for fever, headache or mild pain (pain score 1-3). (Patient not  taking: Reported on 08/03/2023)   Vitamin D, Ergocalciferol, (DRISDOL) 1.25 MG (50000 UNIT) CAPS capsule 1 capsule. (Patient not taking: Reported on 08/03/2023)   No facility-administered encounter medications on file as of 08/03/2023.    Patient Active Problem List   Diagnosis Date Noted   Anxiety about health 08/03/2023   Hyponatremia 07/21/2023   Tonsillitis 07/21/2023   Rash 07/21/2023   Abnormal O'Sullivan glucose challenge test, antepartum 06/25/2022   Anxiety 05/11/2021   Past Medical History:  Diagnosis Date   Anxiety    Depression    High grade squamous intraepithelial lesion (HGSIL), grade 2 CIN, on biopsy of cervix    S/P LEEP (loop electrosurgical excision procedure)    Past Surgical History:  Procedure Laterality Date   CESAREAN SECTION  09/09/2022   CYST EXCISION Left 05/28/2019   Procedure: EXCISION OF  DORSAL CYST LEFT WRIST;  Surgeon: Murrell Kuba, MD;  Location: [facility];  Service: Orthopedics;  Laterality: Left;  IV REGIONAL UPPER ARM BLOCK   laproscopic surgery for ruptured fallopian tube  2018   Family History  Problem Relation Age of Onset   Hypertension Mother    Heart block Mother    ADD / ADHD Mother    Miscarriages / [place] Mother    Depression Father    Arthritis Maternal Grandfather    ADD / ADHD Maternal Grandmother    Varicose Veins Maternal Grandmother    Early death  Paternal Grandfather    Heart disease Paternal Grandfather    ADD / ADHD Maternal Aunt    Miscarriages / Stillbirths Maternal Aunt    Cancer Paternal Uncle    Hearing loss Sister    Miscarriages / [place] Sister    Social History   Socioeconomic History   Marital status: Married    Spouse name: Not on file   Number of children: Not on file   Years of education: Not on file   Highest education level: 12th grade  Occupational History   Not on file  Tobacco Use   Smoking status: Never    Passive exposure: Never   Smokeless tobacco: Never   Substance and Sexual Activity   Alcohol use: Not Currently    Comment: occas   Drug use: Never   Sexual activity: Not Currently    Birth control/protection: Abstinence  Other Topics Concern   Not on file  Social History Narrative   Not on file   Social Drivers of Health   Financial Resource Strain: Low Risk  (07/31/2023)   Overall Financial Resource Strain (CARDIA)    Difficulty of Paying Living Expenses: Not hard at all  Food Insecurity: No Food Insecurity (07/31/2023)   Hunger Vital Sign    Worried About Running Out of Food in the Last Year: Never true    Ran Out of Food in the Last Year: Never true  Transportation Needs: No Transportation Needs (07/31/2023)   PRAPARE - Administrator, Civil Service (Medical): No    Lack of Transportation (Non-Medical): No  Physical Activity: Insufficiently Active (07/31/2023)   Exercise Vital Sign    Days of Exercise per Week: 3 days    Minutes of Exercise per Session: 40 min  Stress: No Stress Concern Present (07/31/2023)   [employer] of Occupational Health - Occupational Stress Questionnaire    Feeling of Stress: Not at all  Social Connections: Moderately Isolated (07/31/2023)   Social Connection and Isolation Panel    Frequency of Communication with Friends and Family: Three times a week    Frequency of Social Gatherings with Friends and Family: Twice a week    Attends Religious Services: Patient declined    Database administrator or Organizations: No    Attends Engineer, structural: Not on file    Marital Status: Married  Catering manager Violence: Not At Risk (07/21/2023)   Humiliation, Afraid, Rape, and Kick questionnaire    Fear of Current or Ex-Partner: No    Emotionally Abused: No    Physically Abused: No    Sexually Abused: No   Outpatient Medications Prior to Visit  Medication Sig Dispense Refill   acetaminophen  (TYLENOL ) 500 MG tablet Take 2 tablets (1,000 mg total) by mouth every 6 (six) hours as  needed for mild pain (pain score 1-3), fever, moderate pain (pain score 4-6) or headache (or Fever >/= 101). (Patient not taking: Reported on 08/03/2023)     ibuprofen  (ADVIL ) 200 MG tablet Take 2-3 tablets (400-600 mg total) by mouth every 8 (eight) hours as needed for fever, headache or mild pain (pain score 1-3). (Patient not taking: Reported on 08/03/2023)     Vitamin D, Ergocalciferol, (DRISDOL) 1.25 MG (50000 UNIT) CAPS capsule 1 capsule. (Patient not taking: Reported on 08/03/2023)     No facility-administered medications prior to visit.   No Known Allergies   ROS: see HPI      Objective  Today's Vitals   08/03/23 1442  BP:  120/71  Pulse: (!) 115  Resp: 15  Temp: 98.6 F (37 C)  TempSrc: Oral  SpO2: 97%  Weight: 257 lb 4.8 oz (116.7 kg)  Height: 5' 5 (1.651 m)  PainSc: 0-No pain    GENERAL: Well-appearing, in NAD. Well nourished.  SKIN: Pink, warm and dry. No rash, lesion, ulceration, or ecchymoses.  Head: Normocephalic. NECK: Trachea midline. Full ROM w/o pain or tenderness. No lymphadenopathy.  EARS: Tympanic membranes are intact, translucent without bulging and without drainage. Appropriate landmarks visualized.  EYES: Conjunctiva clear without exudates. EOMI, PERRL, no drainage present.  NOSE: Septum midline w/o deformity. Nares patent, mucosa pink and non-inflamed w/o drainage. No sinus tenderness.  THROAT: Uvula midline. Oropharynx clear. Tonsils non-inflamed without exudate. Mucous membranes pink and moist.  RESPIRATORY: Chest wall symmetrical. Respirations even and non-labored. Breath sounds clear to auscultation bilaterally.  CARDIAC: S1, S2 present, tachycardic rate and normal rhythm without murmur or gallops. Peripheral pulses 2+ bilaterally.  MSK: Muscle tone and strength appropriate for age. Joints w/o tenderness, redness, or swelling.  EXTREMITIES: Without clubbing, cyanosis, or edema.  NEUROLOGIC: No motor or sensory deficits. Steady, even gait. C2-C12 intact.   PSYCH/MENTAL STATUS: Alert, oriented x 3. Cooperative, appropriate mood and affect.    Assessment & Plan:   1. Encounter to establish care (Primary) Patient is a 13- year-old female who presents today to establish care with primary care at [facility]. Reviewed the past medical history, family history, social history, surgical history, medications and allergies today- updates made as indicated. Patient has concerns today about ongoing tachycardia and rash.    2. Tachycardia Patient presents today with normal blood pressure and elevated heart rate. Patient in no acute distress and is well-appearing. Afebrile and normal oxygen saturation. Denies chest pain, shortness of breath, lower extremity edema, vision changes, headaches. Cardiovascular exam with heart regular rate and rhythm. Normal heart sounds, no murmurs present. No lower extremity edema present. Lungs clear to auscultation bilaterally. Unclear etiology of tachycardia. Referral placed to cardiology and infectious disease due to extensive work-up in ED. Will update patient with results.  - Sed Rate (ESR) - C-reactive protein - ANA Direct w/Reflex if Positive - Ambulatory referral to Cardiology - Ambulatory referral to Infectious Disease  3. Polycythemia Review of labs completed in hospital with RBCs 5.61, Hgb 16.3 and Hct 50.3. Will repeat CBC to determine if patient is still experiencing polycythemia.  - CBC with Differential/Platelet  4. Morbidly obese (HCC) Will obtain hemoglobin A1c today with labs. Will update patient with results.  - Hemoglobin A1c  5. Transaminitis Review of labs performed in ED showed AST 52 and ALT 89. Patient denies abdominal pain, N/V, diarrhea, and current fever. Will repeat hepatitis panel. If still elevated, will complete acute hepatitis panel. Could also be related to fatty liver.  - Hepatic function panel - ANA Direct w/Reflex if Positive - Hepatitis, Acute - Acute Viral Hepatitis (HAV, HBV,  HCV)  6. Other fatigue Will obtain ESR, CRP and ANA due to vague symptoms, along with fatigue.  - Sed Rate (ESR) - C-reactive protein - ANA Direct w/Reflex if Positive   Return in about 4 weeks (around 08/31/2023) for recent hospitalization .   Evalene Arts, FNP

## 2023-08-03 NOTE — Patient Instructions (Signed)
 MyChart:  For all urgent or time sensitive needs we ask that you please call the office to avoid delays. Our number is [phone]) [phone]. MyChart is not constantly monitored and due to the large volume of messages a day, replies may take up to 72 business hours.   MyChart Policy: MyChart allows for you to see your visit notes, after visit summary, provider recommendations, lab and tests results, make an appointment, request refills, and contact your provider or the office for non-urgent questions or concerns. Providers are seeing patients during normal business hours and do not have built in time to review MyChart messages.  We ask that you allow a minimum of 3 business days for responses to [employer]. For this reason, please do not send urgent requests through MyChart. Please call the office at [phone]. New and ongoing conditions may require a visit. We have virtual and in person visit available for your convenience.  Complex MyChart concerns may require a visit. Your provider may request you schedule a virtual or in person visit to ensure we are providing the best care possible. MyChart messages sent after 11:00 AM on Friday will not be received by the provider until Monday morning.    Lab and Test Results: You will receive your lab and test results on MyChart as soon as they are completed and results have been sent by the lab or testing facility. Due to this service, you will receive your results BEFORE your provider.  I review lab and tests results each morning prior to seeing patients. Some results require collaboration with other providers to ensure you are receiving the most appropriate care. For this reason, we ask that you please allow a minimum of 3-5 business days from the time the ALL results have been received for your provider to receive and review lab and test results and contact you about these.  Most lab and test result comments from the provider will be sent through MyChart.  Your provider may recommend changes to the plan of care, follow-up visits, repeat testing, ask questions, or request an office visit to discuss these results. You may reply directly to this message or call the office at [phone] to provide information for the provider or set up an appointment. In some instances, you will be called with test results and recommendations. Please let us know if this is preferred and we will make note of this in your chart to provide this for you.    If you have not heard a response to your lab or test results in 5 business days from all results returning to MyChart, please call the office to let us know. We ask that you please avoid calling prior to this time unless there is an emergent concern. Due to high call volumes, this can delay the resulting process.   After Hours: For all non-emergency after hours needs, please call the office at [phone] and select the option to reach the on-call provider service. On-call services are shared between multiple [facility] offices and therefore it will not be possible to speak directly with your provider. On-call providers may provide medical advice and recommendations, but are unable to provide refills for maintenance medications.  For all emergency or urgent medical needs after normal business hours, we recommend that you seek care at the closest Urgent Care or Emergency Department to ensure appropriate treatment in a timely manner.  [facility] has a 24 hour emergency room located on the ground floor for your  convenience.    Urgent Concerns During the Business Day Providers are seeing patients from 8AM to 5PM, Monday through Thursday, and 8AM to 12PM on Friday with a busy schedule and are most often not able to respond to non-urgent calls until the end of the day or the next business day. If you should have URGENT concerns during the day, please call and speak to the nurse or schedule a same day  appointment so that we can address your concern without delay.    Thank you, again, for choosing me as your health care partner. I appreciate your trust and look forward to learning more about you.    Ashley Reedy, FNP-C

## 2023-08-04 LAB — HCV INTERPRETATION

## 2023-08-04 LAB — ACUTE VIRAL HEPATITIS (HAV, HBV, HCV)
HCV Ab: NONREACTIVE
Hep A IgM: NEGATIVE
Hep B C IgM: NEGATIVE
Hepatitis B Surface Ag: NEGATIVE

## 2023-08-05 LAB — CBC WITH DIFFERENTIAL/PLATELET
Basophils Absolute: 0.1 x10E3/uL (ref 0.0–0.2)
Basos: 1 %
EOS (ABSOLUTE): 0.1 x10E3/uL (ref 0.0–0.4)
Eos: 1 %
Hematocrit: 45 % (ref 34.0–46.6)
Hemoglobin: 14.5 g/dL (ref 11.1–15.9)
Immature Grans (Abs): 0 x10E3/uL (ref 0.0–0.1)
Immature Granulocytes: 0 %
Lymphocytes Absolute: 3.2 x10E3/uL — ABNORMAL HIGH (ref 0.7–3.1)
Lymphs: 28 %
MCH: 28.4 pg (ref 26.6–33.0)
MCHC: 32.2 g/dL (ref 31.5–35.7)
MCV: 88 fL (ref 79–97)
Monocytes Absolute: 0.6 x10E3/uL (ref 0.1–0.9)
Monocytes: 5 %
Neutrophils Absolute: 7.3 x10E3/uL — ABNORMAL HIGH (ref 1.4–7.0)
Neutrophils: 65 %
Platelets: 351 x10E3/uL (ref 150–450)
RBC: 5.11 x10E6/uL (ref 3.77–5.28)
RDW: 13 % (ref 11.7–15.4)
WBC: 11.4 x10E3/uL — ABNORMAL HIGH (ref 3.4–10.8)

## 2023-08-05 LAB — HEPATIC FUNCTION PANEL
ALT: 132 IU/L — ABNORMAL HIGH (ref 0–32)
AST: 58 IU/L — ABNORMAL HIGH (ref 0–40)
Albumin: 4.1 g/dL (ref 3.9–4.9)
Alkaline Phosphatase: 90 IU/L (ref 44–121)
Bilirubin Total: 0.3 mg/dL (ref 0.0–1.2)
Bilirubin, Direct: 0.12 mg/dL (ref 0.00–0.40)
Total Protein: 7.6 g/dL (ref 6.0–8.5)

## 2023-08-05 LAB — HEMOGLOBIN A1C
Est. average glucose Bld gHb Est-mCnc: 108 mg/dL
Hgb A1c MFr Bld: 5.4 % (ref 4.8–5.6)

## 2023-08-05 LAB — SEDIMENTATION RATE: Sed Rate: 36 mm/h — ABNORMAL HIGH (ref 0–32)

## 2023-08-05 LAB — ANA W/REFLEX IF POSITIVE: Anti Nuclear Antibody (ANA): NEGATIVE

## 2023-08-05 LAB — C-REACTIVE PROTEIN: CRP: 1 mg/L (ref 0–10)

## 2023-08-07 ENCOUNTER — Emergency Department (HOSPITAL_COMMUNITY)

## 2023-08-07 ENCOUNTER — Emergency Department (HOSPITAL_COMMUNITY)
Admission: EM | Admit: 2023-08-07 | Discharge: 2023-08-07 | Disposition: A | Discharge status: Home / Self Care | Attending: Emergency Medicine | Admitting: Emergency Medicine

## 2023-08-07 ENCOUNTER — Ambulatory Visit: Admitting: Family Medicine

## 2023-08-07 ENCOUNTER — Ambulatory Visit: Payer: Self-pay

## 2023-08-07 ENCOUNTER — Other Ambulatory Visit: Payer: Self-pay

## 2023-08-07 ENCOUNTER — Encounter (HOSPITAL_COMMUNITY): Payer: Self-pay

## 2023-08-07 DIAGNOSIS — K644 Residual hemorrhoidal skin tags: Secondary | ICD-10-CM | POA: Diagnosis not present

## 2023-08-07 DIAGNOSIS — K625 Hemorrhage of anus and rectum: Secondary | ICD-10-CM | POA: Insufficient documentation

## 2023-08-07 LAB — URINALYSIS, ROUTINE W REFLEX MICROSCOPIC
Bilirubin Urine: NEGATIVE
Glucose, UA: NEGATIVE mg/dL
Hgb urine dipstick: NEGATIVE
Ketones, ur: NEGATIVE mg/dL
Nitrite: NEGATIVE
Protein, ur: NEGATIVE mg/dL
Specific Gravity, Urine: 1.023 (ref 1.005–1.030)
pH: 5 (ref 5.0–8.0)

## 2023-08-07 LAB — COMPREHENSIVE METABOLIC PANEL WITH GFR
ALT: 97 U/L — ABNORMAL HIGH (ref 0–44)
AST: 43 U/L — ABNORMAL HIGH (ref 15–41)
Albumin: 3.5 g/dL (ref 3.5–5.0)
Alkaline Phosphatase: 62 U/L (ref 38–126)
Anion gap: 12 (ref 5–15)
BUN: 14 mg/dL (ref 6–20)
CO2: 20 mmol/L — ABNORMAL LOW (ref 22–32)
Calcium: 9.1 mg/dL (ref 8.9–10.3)
Chloride: 106 mmol/L (ref 98–111)
Creatinine, Ser: 0.78 mg/dL (ref 0.44–1.00)
GFR, Estimated: >60 mL/min (ref >60)
Glucose, Bld: 98 mg/dL (ref 70–99)
Potassium: 3.9 mmol/L (ref 3.5–5.1)
Sodium: 138 mmol/L (ref 135–145)
Total Bilirubin: 0.6 mg/dL (ref 0.0–1.2)
Total Protein: 7.4 g/dL (ref 6.5–8.1)

## 2023-08-07 LAB — CBC
HCT: 42.7 % (ref 36.0–46.0)
Hemoglobin: 14.1 g/dL (ref 12.0–15.0)
MCH: 29 pg (ref 26.0–34.0)
MCHC: 33 g/dL (ref 30.0–36.0)
MCV: 87.9 fL (ref 80.0–100.0)
Platelets: 342 K/uL (ref 150–400)
RBC: 4.86 MIL/uL (ref 3.87–5.11)
RDW: 13.4 % (ref 11.5–15.5)
WBC: 8.8 K/uL (ref 4.0–10.5)
nRBC: 0 % (ref 0.0–0.2)

## 2023-08-07 LAB — LIPASE, BLOOD: Lipase: 32 U/L (ref 11–51)

## 2023-08-07 LAB — I-STAT CG4 LACTIC ACID, ED: Lactic Acid, Venous: 0.6 mmol/L (ref 0.5–1.9)

## 2023-08-07 LAB — PREGNANCY, URINE: Preg Test, Ur: NEGATIVE

## 2023-08-07 LAB — POC OCCULT BLOOD, ED: Fecal Occult Bld: NEGATIVE

## 2023-08-07 MED ORDER — IOHEXOL 350 MG/ML SOLN
75.0000 mL | Freq: Once | INTRAVENOUS | Status: AC | PRN
  Administered 2023-08-07: 75 mL via INTRAVENOUS

## 2023-08-07 MED ORDER — SODIUM CHLORIDE 0.9 % IV BOLUS
1000.0000 mL | Freq: Once | INTRAVENOUS | Status: AC
  Administered 2023-08-07: 1000 mL via INTRAVENOUS

## 2023-08-07 NOTE — Discharge Instructions (Addendum)
 Follow-up with a gastroenterologist for further evaluation.  Return to the emergency room for heavy bleeding abdominal pain fever or other concerns

## 2023-08-07 NOTE — Telephone Encounter (Signed)
 FYI Only or Action Required?: FYI only for provider.  Patient was last seen in primary care on 08/03/2023 by Towana Small, FNP. Called Nurse Triage reporting Blood In Stools, Fatigue, Excessive Sweating, Rash, and Tachycardia. Symptoms began today. Interventions attempted: Rest, hydration, or home remedies. Symptoms are: rapidly worsening.  Triage Disposition: Go to ED Now (Notify PCP)  Patient/caregiver understands and will follow disposition?: Yes      Copied from CRM [id number]. Topic: Clinical - Red Word Triage >> Aug 07, 2023 10:26 AM Sasha H wrote: Red Word that prompted transfer to Nurse Triage: Pt states she spoke with NT this morning about bloody stools, and was told to call back if it got worse. Pt states she just had another bowel movement and this time the entire toilet was filled with blood. Reason for Disposition  [1] MODERATE rectal bleeding (small blood clots, passing blood without stool, or toilet water turns red) AND [2] more than once a day  Answer Assessment - Initial Assessment Questions 1. APPEARANCE of BLOOD: What color is it? Is it passed separately, on the surface of the stool, or mixed in with the stool?      Blood through whole toilet bowl 2. AMOUNT: How much blood was passed?      Dark dark red at bottom of toilet bowl and gets lighter at top of the bowl 4. ONSET: When was the blood first seen in the stools? (Days or weeks)      Last night around 7 pm had BM and toilet bowl had blood in it and couldn't see the poop but thought that it was just hemorrhoid, this morning had BM when first woke up and water wasn't red, wiped and saw blood in poop and inside the poop itself, then 15 min ago another BM entire toilet bowl was just red, not hemorrhoid,  5. DIARRHEA: Is there also some diarrhea? If Yes, ask: How many diarrhea stools in the past 24 hours?      Yes just this past one 15 min ago 8. BLOOD THINNERS: Do you take any blood thinners? (e.g.,  Coumadin/warfarin, Pradaxa/dabigatran, aspirin)     no 9. OTHER SYMPTOMS: Do you have any other symptoms?  (e.g., abdomen pain, vomiting, dizziness, fever)     Heart racing 112 bpm, back pain and joint pain that's just come on over the last few days, also have an itchy rash at top of abdomen, been feeling really tired, have abnormal labs for liver enzymes so NP on Thursday said rash could be from liver Very fatigued, waking up sweating even in [facility] with fan on, pillow is wet and where pillow lays is wet Not too weak to stand, no abdominal pain or fever or vomiting  Advised ED at this time, canceled appt for today with PCP, pt states she will go to ED  Protocols used: Rectal Bleeding-A-AH

## 2023-08-07 NOTE — Telephone Encounter (Signed)
  FYI Only or Action Required?: FYI only for provider.  Patient was last seen in primary care on 08/03/2023 by Towana Small, FNP. Called Nurse Triage reporting Stool Color Change - blood in stool. Symptoms began today. Interventions attempted: Nothing. Symptoms are: Blood in stool rapidly worsening.  Triage Disposition: See Today in Office  Patient/caregiver understands and will follow disposition?: Yes                 Answer Assessment - Initial Assessment Questions 1. COLOR: What color is it? Is that color in part or all of the stool?     Blood in stool 2. ONSET: When was the unusual color first noted?     New blood in stool 3. CAUSE: Have you eaten any food or taken any medicine of this color? Note: See listing in Background Information section.      no 4. OTHER SYMPTOMS: Do you have any other symptoms? (e.g., abdomen pain, diarrhea, jaundice, fever).     Pt states she has blood in stool.  Answer Assessment - Initial Assessment Questions 1. APPEARANCE of BLOOD: What color is it? Is it passed separately, on the surface of the stool, or mixed in with the stool?      Mixed in with stool 2. AMOUNT: How much blood was passed?      unsure 3. FREQUENCY: How many times has blood been passed with the stools?      Today - 2 sttols 4. ONSET: When was the blood first seen in the stools? (Days or weeks)      today 5. DIARRHEA: Is there also some diarrhea? If Yes, ask: How many diarrhea stools in the past 24 hours?      no 6. CONSTIPATION: Do you have constipation? If Yes, ask: How bad is it?     no 7. RECURRENT SYMPTOMS: Have you had blood in your stools before? If Yes, ask: When was the last time? and What happened that time?      no 8. BLOOD THINNERS: Do you take any blood thinners? (e.g., Coumadin/warfarin, Pradaxa/dabigatran, aspirin)     no 9. OTHER SYMPTOMS: Do you have any other symptoms?  (e.g., abdomen pain, vomiting, dizziness,  fever)     *No Answer* 10. PREGNANCY: Is there any chance you are pregnant? When was your last menstrual period?       *No Answer*  Protocols used: Stools - Unusual Color-A-AH, Rectal Bleeding-A-AH

## 2023-08-07 NOTE — ED Triage Notes (Signed)
 Pt to er, pt states that she is here for some blood in her stool. States that the bleeding is bright red, states that she notices it when she uses the bathroom.  States that she also has elevated liver enzymes.  Denies dizziness when standing, but reports some dizziness when transitioning.

## 2023-08-07 NOTE — ED Provider Triage Note (Signed)
 Emergency Medicine Provider Triage Evaluation Note  Ashley [name] , a 33 y.o. female  was evaluated in triage.  Pt complains of blood in stool, elevated heart rate, no diarrhea.  Elevated liver enzymes, some dizziness on standing.  Was admitted a few weeks ago for sepsis secondary to tonsillitis.  Reports 3 episodes of blood in toilet, no blood in underwear between bowel movements, does not take blood thinner..  Review of Systems  Positive: Blood in stool, elevated heart rate Negative: Abdominal pain, nausea, vomiting  Physical Exam  BP (!) 150/102   Pulse (!) 107   Temp 98.8 F (37.1 C)   Resp 16   Ht 5' 5 (1.651 m)   Wt 116.6 kg   LMP 07/20/2023   SpO2 96%   BMI 42.77 kg/m  Gen:   Awake, no distress   Resp:  Normal effort  MSK:   Moves extremities without difficulty  Other:  Elevated heart rate 107  Medical Decision Making  Medically screening exam initiated at 3:35 PM.  Appropriate orders placed.  Ashley [name] was informed that the remainder of the evaluation will be completed by another provider, this initial triage assessment does not replace that evaluation, and the importance of remaining in the ED until their evaluation is complete.  Workup initiated in triage    Rosan Sherlean DEL, [place] 08/07/23 1538

## 2023-08-07 NOTE — Telephone Encounter (Signed)
 Copied from CRM [id number]. Topic: Clinical - Red Word Triage >> Aug 07, 2023  9:07 AM Revonda D wrote: Red Word that prompted transfer to Nurse Triage: Blood in stool, elevated heart rate  Pt stated that she has blood in her stool and heart rate is still elevated at 112 this morning. Pt would like to speak with a nurse for medical advice.

## 2023-08-07 NOTE — ED Notes (Signed)
Dc instructions reviewed with pt no questions or concerns at this time will follow up with pcp 

## 2023-08-07 NOTE — ED Provider Notes (Signed)
 [place] EMERGENCY DEPARTMENT AT [facility] Provider Note   CSN: [id number] Arrival date & time: 08/07/23  1406     Patient presents with: Rectal Bleeding   Ashley [name] is a 33 y.o. female.    Rectal Bleeding    Pt presents with blood in the stool.  Sx started last night.  Pt has noticed blood in the stool then just blood.  Pt states the last episode was at 1030.  No vomiting, no fever.  Some pain in the lower back and joint.  Prior to Admission medications   Medication Sig Start Date End Date Taking? Authorizing Provider  acetaminophen  (TYLENOL ) 500 MG tablet Take 2 tablets (1,000 mg total) by mouth every 6 (six) hours as needed for mild pain (pain score 1-3), fever, moderate pain (pain score 4-6) or headache (or Fever >/= 101). Patient not taking: Reported on 08/03/2023 07/23/23   Patsy Lenis, MD  ibuprofen  (ADVIL ) 200 MG tablet Take 2-3 tablets (400-600 mg total) by mouth every 8 (eight) hours as needed for fever, headache or mild pain (pain score 1-3). Patient not taking: Reported on 08/03/2023 07/23/23   Patsy Lenis, MD  Vitamin D, Ergocalciferol, (DRISDOL) 1.25 MG (50000 UNIT) CAPS capsule 1 capsule. Patient not taking: Reported on 08/03/2023    [provider]    Allergies: Patient has no known allergies.    Review of Systems  Gastrointestinal:  Positive for hematochezia.    Updated Vital Signs BP 131/83   Pulse (!) 104   Temp 98.7 F (37.1 C) (Oral)   Resp 14   Ht 1.651 m (5' 5)   Wt 116.6 kg   LMP 07/20/2023   SpO2 100%   BMI 42.77 kg/m   Physical Exam Vitals and nursing note reviewed.  Constitutional:      General: She is not in acute distress.    Appearance: She is well-developed.  HENT:     Head: Normocephalic and atraumatic.     Right Ear: External ear normal.     Left Ear: External ear normal.  Eyes:     General: No scleral icterus.       Right eye: No discharge.        Left eye: No discharge.     Conjunctiva/sclera:  Conjunctivae normal.  Neck:     Trachea: No tracheal deviation.  Cardiovascular:     Rate and Rhythm: Normal rate and regular rhythm.  Pulmonary:     Effort: Pulmonary effort is normal. No respiratory distress.     Breath sounds: Normal breath sounds. No stridor. No wheezing or rales.  Abdominal:     General: Bowel sounds are normal. There is no distension.     Palpations: Abdomen is soft.     Tenderness: There is no abdominal tenderness. There is no guarding or rebound.  Genitourinary:    Comments: External hemorrhoid noted, no active bleeding, scant blood noted on rectal exam Musculoskeletal:        General: No tenderness or deformity.     Cervical back: Neck supple.  Skin:    General: Skin is warm and dry.     Findings: No rash.  Neurological:     General: No focal deficit present.     Mental Status: She is alert.     Cranial Nerves: No cranial nerve deficit, dysarthria or facial asymmetry.     Sensory: No sensory deficit.     Motor: No abnormal muscle tone or seizure activity.  Coordination: Coordination normal.  Psychiatric:        Mood and Affect: Mood normal.     (all labs ordered are listed, but only abnormal results are displayed) Labs Reviewed  COMPREHENSIVE METABOLIC PANEL WITH GFR - Abnormal; Notable for the following components:      Result Value   CO2 20 (*)    AST 43 (*)    ALT 97 (*)    All other components within normal limits  URINALYSIS, ROUTINE W REFLEX MICROSCOPIC - Abnormal; Notable for the following components:   APPearance HAZY (*)    Leukocytes,Ua MODERATE (*)    Bacteria, UA RARE (*)    All other components within normal limits  CBC  LIPASE, BLOOD  PREGNANCY, URINE  POC OCCULT BLOOD, ED  I-STAT CG4 LACTIC ACID, ED  I-STAT CG4 LACTIC ACID, ED    EKG: EKG Interpretation Date/Time:  Monday August 07 2023 17:26:19 EDT Ventricular Rate:  98 PR Interval:  145 QRS Duration:  85 QT Interval:  339 QTC Calculation: 433 R Axis:   60  Text  Interpretation: Sinus rhythm Low voltage, precordial leads Baseline wander in lead(s) V5 Confirmed by Randol Simmonds [phone]) on 08/07/2023 8:14:39 PM  Radiology: CT ABDOMEN PELVIS W CONTRAST Result Date: 08/07/2023 CLINICAL DATA:  Left lower quadrant abdominal pain and rectal bleeding. EXAM: CT ABDOMEN AND PELVIS WITH CONTRAST TECHNIQUE: Multidetector CT imaging of the abdomen and pelvis was performed using the standard protocol following bolus administration of intravenous contrast. RADIATION DOSE REDUCTION: This exam was performed according to the departmental dose-optimization program which includes automated exposure control, adjustment of the mA and/or kV according to patient size and/or use of iterative reconstruction technique. CONTRAST:  75mL OMNIPAQUE  IOHEXOL  350 MG/ML SOLN COMPARISON:  None Available. FINDINGS: Lower chest: No acute abnormality. Hepatobiliary: There is diffuse fatty infiltration of the liver parenchyma. No focal liver abnormality is seen. No gallstones, gallbladder wall thickening, or biliary dilatation. Pancreas: Unremarkable. No pancreatic ductal dilatation or surrounding inflammatory changes. Spleen: Normal in size without focal abnormality. Adrenals/Urinary Tract: Adrenal glands are unremarkable. Kidneys are normal, without renal calculi, focal lesion, or hydronephrosis. Bladder is unremarkable. Stomach/Bowel: Stomach is within normal limits. Appendix appears normal. No evidence of bowel wall thickening, distention, or inflammatory changes. Vascular/Lymphatic: No significant vascular findings are present. No enlarged abdominal or pelvic lymph nodes. Reproductive: Uterus and bilateral adnexa are unremarkable. Other: No abdominal wall hernia or abnormality. No abdominopelvic ascites. Musculoskeletal: No acute or significant osseous findings. IMPRESSION: 1. Hepatic steatosis. 2. No acute or active process within the abdomen or pelvis. Electronically Signed   By: Suzen Dials M.D.   On:  08/07/2023 20:02     Procedures   Medications Ordered in the ED  sodium chloride  0.9 % bolus 1,000 mL (0 mLs Intravenous Stopped 08/07/23 2000)  iohexol  (OMNIPAQUE ) 350 MG/ML injection 75 mL (75 mLs Intravenous Contrast Given 08/07/23 1911)    Clinical Course as of 08/07/23 2054  Mon Aug 07, 2023  2014 CT scan without acute finding [JK]    Clinical Course User Index [JK] Randol Simmonds, MD                                 Medical Decision Making Problems Addressed: Rectal bleeding: acute illness or injury that poses a threat to life or bodily functions  Amount and/or Complexity of Data Reviewed Radiology: ordered and independent interpretation performed.  Risk Prescription drug management.  Patient presented to the ED for evaluation of rectal bleeding.  ED workup reassuring.  Patient is not anemic.  No signs of leukocytosis or lactic acidosis to suggest systemic infection.  The patient's CT scan does not show evidence of colitis or diverticulitis.  Rectal exam was Hemoccult negative.  No clear source for her bleeding but otherwise patient appears stable.  Will have her follow-up with GI as an outpatient for further evaluation.     Final diagnoses:  Rectal bleeding    ED Discharge Orders     None          Randol Simmonds, MD 08/07/23 2054

## 2023-08-09 ENCOUNTER — Encounter: Payer: Self-pay | Admitting: Gastroenterology

## 2023-08-10 ENCOUNTER — Ambulatory Visit: Payer: Self-pay | Admitting: Family Medicine

## 2023-08-10 ENCOUNTER — Ambulatory Visit: Admitting: Infectious Diseases

## 2023-08-10 ENCOUNTER — Other Ambulatory Visit: Payer: Self-pay | Admitting: Family Medicine

## 2023-08-10 DIAGNOSIS — R7401 Elevation of levels of liver transaminase levels: Secondary | ICD-10-CM

## 2023-08-11 ENCOUNTER — Other Ambulatory Visit: Payer: Self-pay | Admitting: Family Medicine

## 2023-08-13 DIAGNOSIS — R000 Tachycardia, unspecified: Secondary | ICD-10-CM | POA: Insufficient documentation

## 2023-08-13 NOTE — Progress Notes (Unsigned)
 Cardiology Office Note  Date:  08/14/2023   ID:  Ashley [name], DOB 1990-12-01, MRN [id number]  PCP:  Towana Small, FNP   Chief Complaint  Patient presents with   New Patient (Initial Visit)    Ref by Small Towana, FNP for Tachycardia. Patient c/o fatigue/shortness of breath and tachycardia for about 1 month.     HPI:  Ashley [name] is a 33 year old woman with past medical history of Anxiety/depression Hyponatremia Morbid obesity Who presents by referral from Small Towana for tachycardia  Recent records reviewed admitted to [facility] from 6/19-6/22-  rash with sore throat x2 days with congestion, headache and neck stiffness.  diarrhea and fever.  Fever 102.65F with tachycardia and tachypnea with WBC 14.6,  admitted for sepsis protocol.  various testing, including lumbar puncture, UA, CBC, and blood cultures.   On follow-up doctor visits, HR is still high and she is still having a full body rash that is now itchy (was not previously).  She reports her symptoms initially started on 6/4 with a sinus cold, lethargic, tired.  Seen in the ER August 07, 2023 rectal bleeding Heart rate 98  Now reports heart rate running in the 90s Otherwise reports that she feels well, denies tachypalpitations Wondering if it is okay to start Pilates  EKG personally reviewed by myself on todays visit EKG Interpretation Date/Time:  Monday August 14 2023 15:52:57 EDT Ventricular Rate:  96 PR Interval:  144 QRS Duration:  86 QT Interval:  340 QTC Calculation: 429 R Axis:   34  Text Interpretation: Normal sinus rhythm Normal ECG When compared with ECG of 07-Aug-2023 17:26, No significant change was found Confirmed by Perla Lye [phone]) on 08/14/2023 4:07:52 PM    PMH:   has a past medical history of Anxiety, Depression, High grade squamous intraepithelial lesion (HGSIL), grade 2 CIN, on biopsy of cervix, and S/P LEEP (loop electrosurgical excision procedure).  PSH:    Past  Surgical History:  Procedure Laterality Date   CESAREAN SECTION  09/09/2022   CYST EXCISION Left 05/28/2019   Procedure: EXCISION OF  DORSAL CYST LEFT WRIST;  Surgeon: Murrell Kuba, MD;  Location: [facility];  Service: Orthopedics;  Laterality: Left;  IV REGIONAL UPPER ARM BLOCK   laproscopic surgery for ruptured fallopian tube  2018    No current outpatient medications on file.   No current facility-administered medications for this visit.     Allergies:   Patient has no known allergies.   Social History:  The patient  reports that she has never smoked. She has never been exposed to tobacco smoke. She has never used smokeless tobacco. She reports that she does not currently use alcohol. She reports that she does not use drugs.   Family History:   family history includes ADD / ADHD in her maternal aunt, maternal grandmother, and mother; Arthritis in her maternal grandfather; Cancer in her paternal uncle; Coronary artery disease in her mother; Depression in her father; Early death in her paternal grandfather; Hearing loss in her sister; Heart block in her mother; Heart disease in her paternal grandfather; Hypertension in her mother; Miscarriages / Stillbirths in her maternal aunt, mother, and sister; Varicose Veins in her maternal grandmother.    Review of Systems: Review of Systems  Constitutional: Negative.   HENT: Negative.    Respiratory: Negative.    Cardiovascular: Negative.   Gastrointestinal: Negative.   Musculoskeletal: Negative.   Neurological: Negative.   Psychiatric/Behavioral: Negative.    All other  systems reviewed and are negative.  PHYSICAL EXAM: VS:  BP 120/80 (BP Location: Right Arm, Patient Position: Sitting, Cuff Size: Large)   Pulse 96   Ht 5' 5 (1.651 m)   Wt 247 lb 8 oz (112.3 kg)   LMP 07/20/2023   SpO2 98%   BMI 41.19 kg/m  , BMI Body mass index is 41.19 kg/m. GEN: Well nourished, well developed, in no acute distress HEENT:  normal Neck: no JVD, carotid bruits, or masses Cardiac: RRR; no murmurs, rubs, or gallops,no edema  Respiratory:  clear to auscultation bilaterally, normal work of breathing GI: soft, nontender, nondistended, + BS MS: no deformity or atrophy Skin: warm and dry, no rash Neuro:  Strength and sensation are intact Psych: euthymic mood, full affect    Recent Labs: 07/23/2023: Magnesium 2.0 07/28/2023: TSH 1.450 08/07/2023: ALT 97; BUN 14; Creatinine, Ser 0.78; Hemoglobin 14.1; Platelets 342; Potassium 3.9; Sodium 138    Lipid Panel No results found for: CHOL, HDL, LDLCALC, TRIG    Wt Readings from Last 3 Encounters:  08/14/23 247 lb 8 oz (112.3 kg)  08/07/23 257 lb (116.6 kg)  08/03/23 257 lb 4.8 oz (116.7 kg)       ASSESSMENT AND PLAN:  Problem List Items Addressed This Visit     Sinus tachycardia - Primary   Relevant Orders   EKG 12-Lead (Completed)   Anxiety   Sinus tachycardia Likely exacerbated by recent sepsis/viral syndrome May take several weeks to months for heart rate to slowly improve Given asymptomatic, we will hold off on starting beta-blockers Echocardiogram ordered to rule out structural heart disease, rule out pericardial effusion, cardiomyopathy  Obesity We have encouraged continued exercise, careful diet management  Seen in consultation for Evalene Arts and will be referred back to her office for ongoing care of the issues detailed above  Signed, Velinda Lunger, M.D., Ph.D. [facility] [place], [place] [phone]

## 2023-08-14 ENCOUNTER — Encounter: Payer: Self-pay | Admitting: Cardiovascular Disease

## 2023-08-14 ENCOUNTER — Ambulatory Visit: Attending: Cardiovascular Disease | Admitting: Cardiovascular Disease

## 2023-08-14 VITALS — BP 120/80 | HR 96 | Ht 65.0 in | Wt 247.5 lb

## 2023-08-14 DIAGNOSIS — R000 Tachycardia, unspecified: Secondary | ICD-10-CM

## 2023-08-14 DIAGNOSIS — F419 Anxiety disorder, unspecified: Secondary | ICD-10-CM | POA: Diagnosis not present

## 2023-08-14 NOTE — Patient Instructions (Addendum)

## 2023-08-17 ENCOUNTER — Encounter (HOSPITAL_BASED_OUTPATIENT_CLINIC_OR_DEPARTMENT_OTHER): Payer: Self-pay

## 2023-08-23 ENCOUNTER — Encounter (HOSPITAL_BASED_OUTPATIENT_CLINIC_OR_DEPARTMENT_OTHER): Payer: Self-pay

## 2023-08-30 ENCOUNTER — Ambulatory Visit: Admitting: Family Medicine

## 2023-08-30 ENCOUNTER — Other Ambulatory Visit: Payer: Self-pay | Admitting: Medical Genetics

## 2023-08-31 ENCOUNTER — Other Ambulatory Visit

## 2023-08-31 ENCOUNTER — Ambulatory Visit: Admitting: Family Medicine

## 2023-09-04 ENCOUNTER — Ambulatory Visit: Admitting: Family Medicine

## 2023-09-04 ENCOUNTER — Telehealth: Admitting: Physician Assistant

## 2023-09-04 ENCOUNTER — Telehealth: Payer: Self-pay

## 2023-09-04 ENCOUNTER — Encounter: Admitting: Family Medicine

## 2023-09-04 ENCOUNTER — Ambulatory Visit: Payer: Self-pay

## 2023-09-04 DIAGNOSIS — U071 COVID-19: Secondary | ICD-10-CM

## 2023-09-04 MED ORDER — NIRMATRELVIR/RITONAVIR (PAXLOVID)TABLET: 3.0000 | ORAL_TABLET | Freq: Two times a day (BID) | ORAL | 0 refills | Status: AC

## 2023-09-04 NOTE — Telephone Encounter (Signed)
 Called pt and Scheduled a MyChart VV for this evening. Pt is sick with possible COVID - in home family member with COVID.

## 2023-09-04 NOTE — Telephone Encounter (Signed)
 Pt's appt for this afternoon needs to be changed to MyChart VV. Thank you.

## 2023-09-04 NOTE — Telephone Encounter (Signed)
 Summary: possible covid   Patient has possible COVID, has body aches, headaches, runny nose, cough, congestion. 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   Please assist patient further: [phone]     This encounter was created in error - please disregard.

## 2023-09-04 NOTE — Telephone Encounter (Signed)
 Called pt to schedule a MyChart VV. Left message for pt to return call.

## 2023-09-04 NOTE — Patient Instructions (Signed)
 Ashley [name], thank you for joining Delon Ashley Dickinson, PA-C for today's virtual visit.  While this provider is not your primary care provider (PCP), if your PCP is located in our provider database this encounter information will be shared with them immediately following your visit.   A [facility] MyChart account gives you access to today's visit and all your visits, tests, and labs performed at [facility]  click here if you don't have a [facility] MyChart account or go to [email or web address]  Consent: (Patient) Ashley [name] provided verbal consent for this virtual visit at the beginning of the encounter.  Current Medications:  Current Outpatient Medications:    nirmatrelvir /ritonavir  (PAXLOVID ) 20 x 150 MG & 10 x 100MG  TABS, Take 3 tablets by mouth 2 (two) times daily for 5 days. (Take nirmatrelvir  150 mg two tablets twice daily for 5 days and ritonavir  100 mg one tablet twice daily for 5 days) Patient GFR is greater than 60, Disp: 30 tablet, Rfl: 0   Medications ordered in this encounter:  Meds ordered this encounter  Medications   nirmatrelvir /ritonavir  (PAXLOVID ) 20 x 150 MG & 10 x 100MG  TABS    Sig: Take 3 tablets by mouth 2 (two) times daily for 5 days. (Take nirmatrelvir  150 mg two tablets twice daily for 5 days and ritonavir  100 mg one tablet twice daily for 5 days) Patient GFR is greater than 60    Dispense:  30 tablet    Refill:  0    Supervising Provider:   BLAISE ALEENE [name] [phone]     *If you need refills on other medications prior to your next appointment, please contact your pharmacy*  Follow-Up: Call back or seek an in-person evaluation if the symptoms worsen or if the condition fails to improve as anticipated.  [facility] [phone]  Care Instructions: Can take to lessen severity: Vit C 500mg  twice daily Quercertin 250-500mg  twice daily Zinc 75-100mg  daily Melatonin 3-6 mg at bedtime Vit D3 1000-2000 IU  daily Aspirin 81 mg daily with food Optional: Famotidine 20mg  daily Also can add tylenol /ibuprofen  as needed for fevers and body aches May add Mucinex  or Mucinex  DM as needed for cough/congestion    Isolation Instructions: You are to isolate at home until you have been fever free for at least 24 hours without a fever-reducing medication, and symptoms have been steadily improving for 24 hours. At that time,  you can end isolation but need to mask for an additional 5 days.   If you must be around other household members who do not have symptoms, you need to make sure that both you and the family members are masking consistently with a high-quality mask.  If you note any worsening of symptoms despite treatment, please seek an in-person evaluation ASAP. If you note any significant shortness of breath or any chest pain, please seek ER evaluation. Please do not delay care!   COVID-19: What to Do if You Are Sick If you test positive and are an older adult or someone who is at high risk of getting very sick from COVID-19, treatment may be available. Contact a healthcare provider right away after a positive test to determine if you are eligible, even if your symptoms are mild right now. You can also visit a Test to Treat location and, if eligible, receive a prescription from a provider. Don't delay: Treatment must be started within the first few days to be effective. If you have a fever, cough, or other symptoms,  you might have COVID-19. Most people have mild illness and are able to recover at home. If you are sick: Keep track of your symptoms. If you have an emergency warning sign (including trouble breathing), call 911. Steps to help prevent the spread of COVID-19 if you are sick If you are sick with COVID-19 or think you might have COVID-19, follow the steps below to care for yourself and to help protect other people in your home and community. Stay home except to get medical care Stay home. Most  people with COVID-19 have mild illness and can recover at home without medical care. Do not leave your home, except to get medical care. Do not visit public areas and do not go to places where you are unable to wear a mask. Take care of yourself. Get rest and stay hydrated. Take over-the-counter medicines, such as acetaminophen , to help you feel better. Stay in touch with your doctor. Call before you get medical care. Be sure to get care if you have trouble breathing, or have any other emergency warning signs, or if you think it is an emergency. Avoid public transportation, ride-sharing, or taxis if possible. Get tested If you have symptoms of COVID-19, get tested. While waiting for test results, stay away from others, including staying apart from those living in your household. Get tested as soon as possible after your symptoms start. Treatments may be available for people with COVID-19 who are at risk for becoming very sick. Don't delay: Treatment must be started early to be effective--some treatments must begin within 5 days of your first symptoms. Contact your healthcare provider right away if your test result is positive to determine if you are eligible. Self-tests are one of several options for testing for the virus that causes COVID-19 and may be more convenient than laboratory-based tests and point-of-care tests. Ask your healthcare provider or your local health department if you need help interpreting your test results. You can visit your state, tribal, local, and territorial health department's website to look for the latest local information on testing sites. Separate yourself from other people As much as possible, stay in a specific room and away from other people and pets in your home. If possible, you should use a separate bathroom. If you need to be around other people or animals in or outside of the home, wear a well-fitting mask. Tell your close contacts that they may have been exposed to  COVID-19. An infected person can spread COVID-19 starting 48 hours (or 2 days) before the person has any symptoms or tests positive. By letting your close contacts know they may have been exposed to COVID-19, you are helping to protect everyone. See COVID-19 and Animals if you have questions about pets. If you are diagnosed with COVID-19, someone from the health department may call you. Answer the call to slow the spread. Monitor your symptoms Symptoms of COVID-19 include fever, cough, or other symptoms. Follow care instructions from your healthcare provider and local health department. Your local health authorities may give instructions on checking your symptoms and reporting information. When to seek emergency medical attention Look for emergency warning signs* for COVID-19. If someone is showing any of these signs, seek emergency medical care immediately: Trouble breathing Persistent pain or pressure in the chest New confusion Inability to wake or stay awake Pale, gray, or blue-colored skin, lips, or nail beds, depending on skin tone *This list is not all possible symptoms. Please call your medical provider for any other symptoms  that are severe or concerning to you. Call 911 or call ahead to your local emergency facility: Notify the operator that you are seeking care for someone who has or may have COVID-19. Call ahead before visiting your doctor Call ahead. Many medical visits for routine care are being postponed or done by phone or telemedicine. If you have a medical appointment that cannot be postponed, call your doctor's office, and tell them you have or may have COVID-19. This will help the office protect themselves and other patients. If you are sick, wear a well-fitting mask You should wear a mask if you must be around other people or animals, including pets (even at home). Wear a mask with the best fit, protection, and comfort for you. You don't need to wear the mask if you are  alone. If you can't put on a mask (because of trouble breathing, for example), cover your coughs and sneezes in some other way. Try to stay at least 6 feet away from other people. This will help protect the people around you. Masks should not be placed on young children under age 64 years, anyone who has trouble breathing, or anyone who is not able to remove the mask without help. Cover your coughs and sneezes Cover your mouth and nose with a tissue when you cough or sneeze. Throw away used tissues in a lined trash can. Immediately wash your hands with soap and water for at least 20 seconds. If soap and water are not available, clean your hands with an alcohol-based hand sanitizer that contains at least 60% alcohol. Clean your hands often Wash your hands often with soap and water for at least 20 seconds. This is especially important after blowing your nose, coughing, or sneezing; going to the bathroom; and before eating or preparing food. Use hand sanitizer if soap and water are not available. Use an alcohol-based hand sanitizer with at least 60% alcohol, covering all surfaces of your hands and rubbing them together until they feel dry. Soap and water are the best option, especially if hands are visibly dirty. Avoid touching your eyes, nose, and mouth with unwashed hands. Handwashing Tips Avoid sharing personal household items Do not share dishes, drinking glasses, cups, eating utensils, towels, or bedding with other people in your home. Wash these items thoroughly after using them with soap and water or put in the dishwasher. Clean surfaces in your home regularly Clean and disinfect high-touch surfaces (for example, doorknobs, tables, handles, light switches, and countertops) in your sick room and bathroom. In shared spaces, you should clean and disinfect surfaces and items after each use by the person who is ill. If you are sick and cannot clean, a caregiver or other person should only clean and  disinfect the area around you (such as your bedroom and bathroom) on an as needed basis. Your caregiver/other person should wait as long as possible (at least several hours) and wear a mask before entering, cleaning, and disinfecting shared spaces that you use. Clean and disinfect areas that may have blood, stool, or body fluids on them. Use household cleaners and disinfectants. Clean visible dirty surfaces with household cleaners containing soap or detergent. Then, use a household disinfectant. Use a product from [employer] List N: Disinfectants for Coronavirus (COVID-19). Be sure to follow the instructions on the label to ensure safe and effective use of the product. Many products recommend keeping the surface wet with a disinfectant for a certain period of time (look at contact time on the product  label). You may also need to wear personal protective equipment, such as gloves, depending on the directions on the product label. Immediately after disinfecting, wash your hands with soap and water for 20 seconds. For completed guidance on cleaning and disinfecting your home, visit Complete Disinfection Guidance. Take steps to improve ventilation at home Improve ventilation (air flow) at home to help prevent from spreading COVID-19 to other people in your household. Clear out COVID-19 virus particles in the air by opening windows, using air filters, and turning on fans in your home. Use this interactive tool to learn how to improve air flow in your home. When you can be around others after being sick with COVID-19 Deciding when you can be around others is different for different situations. Find out when you can safely end home isolation. For any additional questions about your care, contact your healthcare provider or state or local health department. 04/21/2020 Content source: [facility] ([facility]), Division of Viral Diseases This information is not intended  to replace advice given to you by your health care provider. Make sure you discuss any questions you have with your health care provider. Document Revised: 06/04/2020 Document Reviewed: 06/04/2020 Elsevier Patient Education  2022 [employer].     If you have been instructed to have an in-person evaluation today at a local Urgent Care facility, please use the link below. It will take you to a list of all of our available [facility], including address, phone number and hours of operation. Please do not delay care.  [facility]  If you or a family member do not have a primary care provider, use the link below to schedule a visit and establish care. When you choose a [facility] primary care physician or advanced practice provider, you gain a long-term partner in health. Find a Primary Care Provider  Learn more about [facility] in-office and virtual care options: [facility] - Get Care Now

## 2023-09-04 NOTE — Telephone Encounter (Signed)
 FYI Only or Action Required?: Action required by provider: request for appointment and medication refill request.  Patient was last seen in primary care on 08/03/2023 by Towana Small, FNP.  Called Nurse Triage reporting Covid Exposure.  Symptoms began several days ago.  Interventions attempted: Rest, hydration, or home remedies.  Symptoms are: unchanged.  Triage Disposition: Call PCP Within 24 Hours  Patient/caregiver understands and will follow disposition?: UnsureCopied from CRM #[id number]. Topic: Clinical - Pink Word Triage >> Sep 04, 2023  9:03 AM Tobias L wrote: Reason for Triage: Possible COVID >> Sep 04, 2023  9:05 AM Tobias L wrote: Patient has possible COVID, has body aches, headaches, runny nose, cough, congestion. 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    Please assist patient further: [phone] Reason for Disposition  [1] Continuous (nonstop) coughing interferes with work or school AND [2] no improvement using cough treatment per Care Advice  Answer Assessment - Initial Assessment Questions Pt is asking if ok to be prescribed paxlovid  while breast feeding. Pt wants to know if possibility of being prescribed. If not, pt doesn't want to keep appt due distance traveling. CAL was called but unable to speak with nurse. Pt needs to leave by 1430. Please advise. Appt was made for 1520.      1. COVID-19 EXPOSURE: Please describe how you were exposed to someone with a COVID-19 infection.     Both kids tested positive yesterday  2. PLACE of CONTACT: Where were you when you were exposed to COVID-19? (e.g., home, school, medical waiting room; which city?)     home 3. TYPE of CONTACT: How much contact was there? (e.g., sitting next to, live in same house, work in same office, same building)     Same house 4. DURATION of CONTACT: How long were you in contact with the COVID-19 patient? (e.g., a few seconds, passed by person, a few minutes, 15  minutes or longer, live with the patient)     Live with 5. DATE of CONTACT: When did you have contact with a COVID-19 patient? (e.g., hours, days ago)     na 6. MASK: Were you wearing a mask? Was the other person wearing a mask? Note: wearing a mask reduces the risk of an otherwise close contact.     Na  7. SYMPTOMS: Do you have any symptoms? (e.g., fever, cough, breathing difficulty, loss of taste or smell)     Headache, body ache, runny cough, congestion 8. COVID-19 VACCINE: Have you had the COVID-19 vaccine? If Yes, ask: When did you last get it?     na 9. PREGNANCY OR POSTPARTUM: Is there any chance you are pregnant? When was your last menstrual period? Did you deliver in the last 2 weeks?     Breastfeeding  10. HIGH RISK: Do you have any heart or lung problems? (e.g., asthma, COPD, heart failure) Do you have a weak immune system or other risk factors? (e.g., HIV positive, chemotherapy, renal failure, diabetes mellitus, sickle cell anemia, obesity)       Elevated HR- referred to cardiologist  Answer Assessment - Initial Assessment Questions 1. SYMPTOMS: What is your main symptom or concern? (e.g., cough, fever, shortness of breath, muscle aches)     cough 2. ONSET: When did the symptoms start?      Several days ago  3. COUGH: Do you have a cough? If Yes, ask: How bad is the cough?       yes 4. FEVER: Do  you have a fever? If Yes, ask: What is your temperature, how was it measured, and when did it start?     Not sure 5. BREATHING DIFFICULTY: Are you having any difficulty breathing? (e.g., normal; shortness of breath, wheezing, unable to speak)      denies 6. BETTER-SAME-WORSE: Are you getting better, staying the same or getting worse compared to yesterday?  If getting worse, ask, In what way?     worse 7. OTHER SYMPTOMS: Do you have any other symptoms?  (e.g., chills, fatigue, headache, loss of smell or taste, muscle pain, sore throat)     Body  asches 8. COVID-19 DIAGNOSIS: How do you know that you have COVID? (e.g., positive lab test or self-test, diagnosed by doctor or NP/PA, symptoms after exposure).     Kids are positive 9. COVID-19 EXPOSURE: Was there any known exposure to COVID before the symptoms began?      yes 10. COVID-19 VACCINE: Have you had the COVID-19 vaccine? If Yes, ask: When did you last get it?       na 11. HIGH RISK DISEASE: Do you have any chronic medical problems? (e.g., asthma, heart or lung disease, weak immune system, obesity, etc.)       na 12. PREGNANCY: Is there any chance you are pregnant? When was your last menstrual period?       deneis 13. O2 SATURATION MONITOR:  Do you use an oxygen saturation monitor (pulse oximeter) at home? If Yes, ask What is your reading (oxygen level) today? What is your usual oxygen saturation reading? (e.g., 95%)       na  Protocols used: COVID-19 - Exposure-A-AH, COVID-19 - Diagnosed or Suspected-A-AH

## 2023-09-04 NOTE — Progress Notes (Signed)
 Virtual Visit Consent   Ashley [name], you are scheduled for a virtual visit with a [facility] provider today. Just as with appointments in the office, your consent must be obtained to participate. Your consent will be active for this visit and any virtual visit you may have with one of our providers in the next 365 days. If you have a MyChart account, a copy of this consent can be sent to you electronically.  As this is a virtual visit, video technology does not allow for your provider to perform a traditional examination. This may limit your provider's ability to fully assess your condition. If your provider identifies any concerns that need to be evaluated in person or the need to arrange testing (such as labs, EKG, etc.), we will make arrangements to do so. Although advances in technology are sophisticated, we cannot ensure that it will always work on either your end or our end. If the connection with a video visit is poor, the visit may have to be switched to a telephone visit. With either a video or telephone visit, we are not always able to ensure that we have a secure connection.  By engaging in this virtual visit, you consent to the provision of healthcare and authorize for your insurance to be billed (if applicable) for the services provided during this visit. Depending on your insurance coverage, you may receive a charge related to this service.  I need to obtain your verbal consent now. Are you willing to proceed with your visit today? [name] has provided verbal consent on 09/04/2023 for a virtual visit (video or telephone). Delon [name], PA-C  Date: 09/04/2023 6:26 PM   Virtual Visit via Video Note   I, Delon [name], connected with  Ashley [name]  ([id number], [birth year]) on 09/04/23 at  6:15 PM EDT by a video-enabled telemedicine application and verified that I am speaking with the correct person using two identifiers.  Location: Patient: Virtual Visit Location  Patient: Home Provider: Virtual Visit Location Provider: Home Office   I discussed the limitations of evaluation and management by telemedicine and the availability of in person appointments. The patient expressed understanding and agreed to proceed.    History of Present Illness: Ashley [name] is a 33 y.o. who identifies as a female who was assigned female at birth, and is being seen today for Covid 9.  HPI: URI  This is a new problem. The current episode started in the past 7 days (Started lat night 09/02/23). The problem has been gradually worsening. There has been no fever. Associated symptoms include congestion, coughing, headaches, rhinorrhea, sinus pain and a sore throat. She has tried nothing for the symptoms. The treatment provided no relief.    Problems:  Patient Active Problem List   Diagnosis Date Noted   Sinus tachycardia 08/13/2023   Anxiety about health 08/03/2023   Hyponatremia 07/21/2023   Tonsillitis 07/21/2023   Rash 07/21/2023   Abnormal O'Sullivan glucose challenge test, antepartum 06/25/2022   Anxiety 05/11/2021    Allergies: No Known Allergies Medications:  Current Outpatient Medications:    nirmatrelvir /ritonavir  (PAXLOVID ) 20 x 150 MG & 10 x 100MG  TABS, Take 3 tablets by mouth 2 (two) times daily for 5 days. (Take nirmatrelvir  150 mg two tablets twice daily for 5 days and ritonavir  100 mg one tablet twice daily for 5 days) Patient GFR is greater than 60, Disp: 30 tablet, Rfl: 0  Observations/Objective: Patient is well-developed, well-nourished in no acute distress.  Resting comfortably at  home.  Head is normocephalic, atraumatic.  No labored breathing.  Speech is clear and coherent with logical content.  Patient is alert and oriented at baseline.    Assessment and Plan: 1. COVID-19 (Primary) - nirmatrelvir /ritonavir  (PAXLOVID ) 20 x 150 MG & 10 x 100MG  TABS; Take 3 tablets by mouth 2 (two) times daily for 5 days. (Take nirmatrelvir  150 mg two tablets  twice daily for 5 days and ritonavir  100 mg one tablet twice daily for 5 days) Patient GFR is greater than 60  Dispense: 30 tablet; Refill: 0  - Continue OTC symptomatic management of choice - Will send OTC vitamins and supplement information through AVS - Paxlovid  prescribed; she is breastfeeding and risk were discussed based off information from [facility] app, she voiced understanding and desires to try Paxlovid  - Patient enrolled in MyChart symptom monitoring - Push fluids - Rest as needed - Discussed return precautions and when to seek in-person evaluation, sent via AVS as well  Follow Up Instructions: I discussed the assessment and treatment plan with the patient. The patient was provided an opportunity to ask questions and all were answered. The patient agreed with the plan and demonstrated an understanding of the instructions.  A copy of instructions were sent to the patient via MyChart unless otherwise noted below.    The patient was advised to call back or seek an in-person evaluation if the symptoms worsen or if the condition fails to improve as anticipated.    Delon [name], PA-C

## 2023-09-20 ENCOUNTER — Ambulatory Visit: Attending: Cardiovascular Disease

## 2023-09-29 ENCOUNTER — Ambulatory Visit: Admitting: Gastroenterology

## 2023-09-29 NOTE — Progress Notes (Deleted)
 Chief Complaint:rectal bleeding Primary GI Doctor:***  HPI:  Patient is a  33  year old female patient with past medical history of anxiety,depression, who was self referred to me for a evaluation of rectal bleeding .    admitted to [facility] from 6/19-6/22-  rash with sore throat x2 days with congestion, headache and neck stiffness.  diarrhea and fever.  Fever 102.77F with tachycardia and tachypnea with WBC 14.6,   08/07/23 Patient seen in ED for rectal bleeding. Labs show: Hgb 14.1, wbc 8.8, BUN 14, Creat 0.78, AST 43, ALT 97, lipase 32. Neg CTAP. Rectal exam was Hemoccult negative.   08/14/23 seen by cardiology for sinus tachycardia.Likely exacerbated by recent sepsis/viral syndrome. No medication started. Echo ordered.   Interval History  Patient admits/denies GERD Patient admits/denies dysphagia Patient admits/denies nausea, vomiting, or weight loss  Patient admits/denies altered bowel habits Patient admits/denies abdominal pain Patient admits/denies rectal bleeding   Denies/Admits alcohol Denies/Admits smoking Denies/Admits NSAID use. Denies/Admits they are on blood thinners.  Patients last colonoscopy Patients last EGD  Surgical history:  Patient's family history includes  Wt Readings from Last 3 Encounters:  08/14/23 247 lb 8 oz (112.3 kg)  08/07/23 257 lb (116.6 kg)  08/03/23 257 lb 4.8 oz (116.7 kg)      Past Medical History:  Diagnosis Date   Anxiety    Depression    High grade squamous intraepithelial lesion (HGSIL), grade 2 CIN, on biopsy of cervix    S/P LEEP (loop electrosurgical excision procedure)     Past Surgical History:  Procedure Laterality Date   CESAREAN SECTION  09/09/2022   CYST EXCISION Left 05/28/2019   Procedure: EXCISION OF  DORSAL CYST LEFT WRIST;  Surgeon: Murrell Kuba, MD;  Location: [facility];  Service: Orthopedics;  Laterality: Left;  IV REGIONAL UPPER ARM BLOCK   laproscopic surgery for ruptured fallopian  tube  2018    No current outpatient medications on file.   No current facility-administered medications for this visit.    Allergies as of 09/29/2023   (No Known Allergies)    Family History  Problem Relation Age of Onset   Hypertension Mother    Heart block Mother    ADD / ADHD Mother    Miscarriages / [place] Mother    Coronary artery disease Mother    Depression Father    Hearing loss Sister    Miscarriages / [place] Sister    ADD / ADHD Maternal Aunt    Miscarriages / Stillbirths Maternal Aunt    Cancer Paternal Uncle    ADD / ADHD Maternal Grandmother    Varicose Veins Maternal Grandmother    Arthritis Maternal Grandfather    Early death Paternal Grandfather    Heart disease Paternal Grandfather     Review of Systems:    Constitutional: No weight loss, fever, chills, weakness or fatigue HEENT: Eyes: No change in vision               Ears, Nose, Throat:  No change in hearing or congestion Skin: No rash or itching Cardiovascular: No chest pain, chest pressure or palpitations   Respiratory: No SOB or cough Gastrointestinal: See HPI and otherwise negative Genitourinary: No dysuria or change in urinary frequency Neurological: No headache, dizziness or syncope Musculoskeletal: No new muscle or joint pain Hematologic: No bleeding or bruising Psychiatric: No history of depression or anxiety    Physical Exam:  Vital signs: There were no vitals taken for this visit.  Constitutional:   Pleasant *** female/female appears to be in NAD, Well developed, Well nourished, alert and cooperative Eyes:   PEERL, EOMI. No icterus. Conjunctiva pink. Neck:  Supple Throat: Oral cavity and pharynx without inflammation, swelling or lesion.  Respiratory: Respirations even and unlabored. Lungs clear to auscultation bilaterally.   No wheezes, crackles, or rhonchi.  Cardiovascular: Normal S1, S2. Regular rate and rhythm. No peripheral edema, cyanosis or pallor.  Gastrointestinal:   Soft, nondistended, nontender. No rebound or guarding. Normal bowel sounds. No appreciable masses or hepatomegaly. Rectal:  Not performed.  Anoscopy: Msk:  Symmetrical without gross deformities. Without edema, no deformity or joint abnormality.  Neurologic:  Alert and  oriented x4;  grossly normal neurologically.  Skin:   Dry and intact without significant lesions or rashes.  RELEVANT LABS AND IMAGING: CBC    Latest Ref Rng & Units 08/07/2023    3:45 PM 08/03/2023    4:14 PM 07/28/2023   10:41 AM  CBC  WBC 4.0 - 10.5 K/uL 8.8  11.4  10.6   Hemoglobin 12.0 - 15.0 g/dL 85.8  85.4  83.6   Hematocrit 36.0 - 46.0 % 42.7  45.0  50.3   Platelets 150 - 400 K/uL 342  351  441      CMP     Latest Ref Rng & Units 08/07/2023    3:45 PM 08/03/2023    4:14 PM 07/28/2023   10:41 AM  CMP  Glucose 70 - 99 mg/dL 98   850   BUN 6 - 20 mg/dL 14   14   Creatinine 9.55 - 1.00 mg/dL 9.21   9.12   Sodium 864 - 145 mmol/L 138   137   Potassium 3.5 - 5.1 mmol/L 3.9   4.8   Chloride 98 - 111 mmol/L 106   100   CO2 22 - 32 mmol/L 20   20   Calcium 8.9 - 10.3 mg/dL 9.1   9.4   Total Protein 6.5 - 8.1 g/dL 7.4  7.6  8.2   Total Bilirubin 0.0 - 1.2 mg/dL 0.6  0.3  0.4   Alkaline Phos 38 - 126 U/L 62  90  117   AST 15 - 41 U/L 43  58  52   ALT 0 - 44 U/L 97  132  89      Lab Results  Component Value Date   TSH 1.450 07/28/2023   08/2023 echo- pending   08/07/23 CTAP IMPRESSION: 1. Hepatic steatosis. 2. No acute or active process within the abdomen or pelvis.   Assessment: Elevated LFTs Hepatic steatosis  Plan: 1. ***   Thank you for the courtesy of this consult. Please call me with any questions or concerns.   Janika Jedlicka, FNP-C [facility] 09/29/2023, 6:23 AM  Cc: Towana Small, FNP

## 2023-10-09 ENCOUNTER — Encounter: Payer: Self-pay | Admitting: Family Medicine

## 2023-10-09 ENCOUNTER — Encounter: Admitting: Family Medicine

## 2023-10-09 ENCOUNTER — Ambulatory Visit (INDEPENDENT_AMBULATORY_CARE_PROVIDER_SITE_OTHER): Admitting: Family Medicine

## 2023-10-09 ENCOUNTER — Other Ambulatory Visit (HOSPITAL_COMMUNITY)
Admission: RE | Admit: 2023-10-09 | Discharge: 2023-10-09 | Disposition: A | Discharge status: Home / Self Care | Source: Ambulatory Visit | Attending: Family Medicine | Admitting: Family Medicine

## 2023-10-09 VITALS — BP 128/84 | HR 80 | Resp 16 | Ht 65.0 in | Wt 251.6 lb

## 2023-10-09 DIAGNOSIS — R41840 Attention and concentration deficit: Secondary | ICD-10-CM

## 2023-10-09 DIAGNOSIS — G8929 Other chronic pain: Secondary | ICD-10-CM | POA: Diagnosis not present

## 2023-10-09 DIAGNOSIS — M546 Pain in thoracic spine: Secondary | ICD-10-CM | POA: Diagnosis not present

## 2023-10-09 DIAGNOSIS — Z124 Encounter for screening for malignant neoplasm of cervix: Secondary | ICD-10-CM

## 2023-10-09 DIAGNOSIS — Z0000 Encounter for general adult medical examination without abnormal findings: Secondary | ICD-10-CM | POA: Diagnosis not present

## 2023-10-09 DIAGNOSIS — Z23 Encounter for immunization: Secondary | ICD-10-CM | POA: Diagnosis not present

## 2023-10-09 NOTE — Patient Instructions (Signed)
 Health Maintenance Recommendations Screening Testing Mammogram Every 1 -2 years based on history and risk factors Starting at age 33 Pap Smear Ages 21-39 every 3 years Ages 23-65 every 5 years with HPV testing More frequent testing may be required based on results and history Colon Cancer Screening Every 1-10 years based on test performed, risk factors, and history Starting at age 102 Bone Density Screening Every 2-10 years based on history Starting at age 69 for women Recommendations for men differ based on medication usage, history, and risk factors AAA Screening One time ultrasound Men 30-10 years old who have every smoked Lung Cancer Screening Low Dose Lung CT every 12 months Age 20-80 years with a 30 pack-year smoking history who still smoke or who have quit within the last 15 years   Screening Labs Routine  Labs: Complete Blood Count (CBC), Complete Metabolic Panel (CMP), Cholesterol (Lipid Panel) Every 6-12 months based on history and medications May be recommended more frequently based on current conditions or previous results Hemoglobin A1c Lab Every 3-12 months based on history and previous results Starting at age 24 or earlier with diagnosis of diabetes, high cholesterol, BMI >26, and/or risk factors Frequent monitoring for patients with diabetes to ensure blood sugar control Thyroid Panel (TSH w/ T3 & T4) Every 6 months based on history, symptoms, and risk factors May be repeated more often if on medication HIV One time testing for all patients 23 and older May be repeated more frequently for patients with increased risk factors or exposure Hepatitis C One time testing for all patients 47 and older May be repeated more frequently for patients with increased risk factors or exposure Gonorrhea, Chlamydia Every 12 months for all sexually active persons 13-24 years Additional monitoring may be recommended for those who are considered high risk or who have  symptoms PSA Men 72-66 years old with risk factors Additional screening may be recommended from age 2-69 based on risk factors, symptoms, and history   Vaccine Recommendations Tetanus Booster All adults every 10 years Flu Vaccine All patients 6 months and older every year COVID Vaccine All patients 12 years and older Initial dosing with booster May recommend additional booster based on age and health history HPV Vaccine 2 doses all patients age 56-26 Dosing may be considered for patients over 26 Shingles Vaccine (Shingrix) 2 doses all adults 55 years and older Pneumonia (Pneumovax 23) All adults 65 years and older May recommend earlier dosing based on health history Pneumonia (Prevnar 16) All adults 65 years and older Dosed 1 year after Pneumovax 23   Additional Screening, Testing, and Vaccinations may be recommended on an individualized basis based on family history, health history, risk factors, and/or exposure.  __________________________________________________________   Diet Recommendations for All Patients   I recommend that all patients maintain a diet low in saturated fats, carbohydrates, and cholesterol. While this can be challenging at first, it is not impossible and small changes can make big differences.  Things to try: Decreasing the amount of soda, sweet tea, and/or juice to one or less per day and replace with water While water is always the first choice, if you do not like water you may consider adding a water additive without sugar to improve the taste other sugar free drinks Replace potatoes with a brightly colored vegetable at dinner Use healthy oils, such as canola oil or olive oil, instead of butter or hard margarine Limit your bread intake to two pieces or less a day Replace regular pasta with  low carb pasta options Bake, broil, or grill foods instead of frying Monitor portion sizes  Eat smaller, more frequent meals throughout the day instead of large  meals   An important thing to remember is, if you love foods that are not great for your health, you don't have to give them up completely. Instead, allow these foods to be a reward when you have done well. Allowing yourself to still have special treats every once in a while is a nice way to tell yourself thank you for working hard to keep yourself healthy.    Also remember that every day is a new day. If you have a bad day and "fall off the wagon", you can still climb right back up and keep moving along on your journey!   We have resources available to help you!  Some websites that may be helpful include: www.http://www.wall-moore.info/        Www.VeryWellFit.com _____________________________________________________________   Activity Recommendations for All Patients   I recommend that all adults get at least 20 minutes of moderate physical activity that elevates your heart rate at least 5 days out of the week.  Some examples include: Walking or jogging at a pace that allows you to carry on a conversation Cycling (stationary bike or outdoors) Water aerobics Yoga Weight lifting Dancing If physical limitations prevent you from putting stress on your joints, exercise in a pool or seated in a chair are excellent options.   Do determine your MAXIMUM heart rate for activity: YOUR AGE - 220 = MAX HeartRate    Remember! Do not push yourself too hard.  Start slowly and build up your pace, speed, weight, time in exercise, etc.  Allow your body to rest between exercise and get good sleep. You will need more water than normal when you are exerting yourself. Do not wait until you are thirsty to drink. Drink with a purpose of getting in at least 8, 8 ounce glasses of water a day plus more depending on how much you exercise and sweat.      If you begin to develop dizziness, chest pain, abdominal pain, jaw pain, shortness of breath, headache, vision changes, lightheadedness, or other concerning symptoms, stop the  activity and allow your body to rest. If your symptoms are severe, seek emergency evaluation immediately. If your symptoms are concerning, but not severe, please let us know so that we can recommend further evaluation.

## 2023-10-09 NOTE — Progress Notes (Signed)
 Subjective:   Ashley [name] 04/28/1990  10/09/2023   CC: Chief Complaint  Patient presents with   Annual Exam   HPI: Ashley [name] is a 33 y.o. female who presents for a routine health maintenance exam.  Fasting labs collected at time of visit.   HEALTH SCREENINGS: - Vision Screening: up to date - Dental Visits: up to date - Pap smear: up to date, would like to repeat today  - Breast Exam: declined - STD Screening: Declined - Mammogram (40+): Not applicable  - Colonoscopy (45+): Not applicable  - Bone Density (65+ or under 65 with predisposing conditions): Not applicable  - Lung CA screening with low-dose CT:  Not applicable Adults age 38-80 who are current cigarette smokers or quit within the last 15 years. Must have 20 pack year history.   Depression and Anxiety Screen done today and results listed below:     10/09/2023    9:24 AM 08/03/2023    2:52 PM  Depression screen PHQ 2/9  Decreased Interest 0 0  Down, Depressed, Hopeless 0 0  PHQ - 2 Score 0 0  Altered sleeping 0 3  Tired, decreased energy 0 3  Change in appetite 0 1  Feeling bad or failure about yourself  1 0  Trouble concentrating 0 0  Moving slowly or fidgety/restless 0 0  Suicidal thoughts 0 0  PHQ-9 Score 1 7  Difficult doing work/chores Not difficult at all Not difficult at all      10/09/2023    9:25 AM 08/03/2023    2:53 PM  GAD 7 : Generalized Anxiety Score  Nervous, Anxious, on Edge 1 0  Control/stop worrying 0 0  Worry too much - different things 1 0  Trouble relaxing 1 0  Restless 0 0  Easily annoyed or irritable 1 0  Afraid - awful might happen 1 0  Total GAD 7 Score 5 0  Anxiety Difficulty Not difficult at all Not difficult at all    IMMUNIZATIONS: - Tdap: Tetanus vaccination status reviewed: last tetanus booster within 10 years. - HPV: Up to date - Influenza: Administered today - Pneumovax: Not applicable - Prevnar 20: Not applicable - Zostavax (50+): Not applicable   Past  medical history, surgical history, medications, allergies, family history and social history reviewed with patient today and changes made to appropriate areas of the chart.   Past Medical History:  Diagnosis Date   Anxiety    Depression    High grade squamous intraepithelial lesion (HGSIL), grade 2 CIN, on biopsy of cervix    S/P LEEP (loop electrosurgical excision procedure)     Past Surgical History:  Procedure Laterality Date   CESAREAN SECTION  09/09/2022   CYST EXCISION Left 05/28/2019   Procedure: EXCISION OF  DORSAL CYST LEFT WRIST;  Surgeon: Murrell Kuba, MD;  Location: [facility];  Service: Orthopedics;  Laterality: Left;  IV REGIONAL UPPER ARM BLOCK   laproscopic surgery for ruptured fallopian tube  2018    No current outpatient medications on file prior to visit.   No current facility-administered medications on file prior to visit.    No Known Allergies   Social History   Socioeconomic History   Marital status: Married    Spouse name: Not on file   Number of children: Not on file   Years of education: Not on file   Highest education level: 12th grade  Occupational History   Not on file  Tobacco Use   Smoking status:  Never    Passive exposure: Never   Smokeless tobacco: Never  Vaping Use   Vaping status: Never Used  Substance and Sexual Activity   Alcohol use: Not Currently    Comment: occas   Drug use: Never   Sexual activity: Not Currently    Birth control/protection: Abstinence  Other Topics Concern   Not on file  Social History Narrative   Not on file   Social Drivers of Health   Financial Resource Strain: Low Risk  (07/31/2023)   Overall Financial Resource Strain (CARDIA)    Difficulty of Paying Living Expenses: Not hard at all  Food Insecurity: No Food Insecurity (07/31/2023)   Hunger Vital Sign    Worried About Running Out of Food in the Last Year: Never true    Ran Out of Food in the Last Year: Never true  Transportation  Needs: No Transportation Needs (07/31/2023)   PRAPARE - Administrator, Civil Service (Medical): No    Lack of Transportation (Non-Medical): No  Physical Activity: Insufficiently Active (07/31/2023)   Exercise Vital Sign    Days of Exercise per Week: 3 days    Minutes of Exercise per Session: 40 min  Stress: No Stress Concern Present (07/31/2023)   [employer] of Occupational Health - Occupational Stress Questionnaire    Feeling of Stress: Not at all  Social Connections: Moderately Isolated (07/31/2023)   Social Connection and Isolation Panel    Frequency of Communication with Friends and Family: Three times a week    Frequency of Social Gatherings with Friends and Family: Twice a week    Attends Religious Services: Patient declined    Database administrator or Organizations: No    Attends Engineer, structural: Not on file    Marital Status: Married  Catering manager Violence: Not At Risk (07/21/2023)   Humiliation, Afraid, Rape, and Kick questionnaire    Fear of Current or Ex-Partner: No    Emotionally Abused: No    Physically Abused: No    Sexually Abused: No   Social History   Tobacco Use  Smoking Status Never   Passive exposure: Never  Smokeless Tobacco Never   Social History   Substance and Sexual Activity  Alcohol Use Not Currently   Comment: occas    Family History  Problem Relation Age of Onset   Hypertension Mother    Heart block Mother    ADD / ADHD Mother    Miscarriages / [place] Mother    Coronary artery disease Mother    Depression Father    Hearing loss Sister    Miscarriages / [place] Sister    ADD / ADHD Maternal Aunt    Miscarriages / Stillbirths Maternal Aunt    Cancer Paternal Uncle    ADD / ADHD Maternal Grandmother    Varicose Veins Maternal Grandmother    Arthritis Maternal Grandfather    Hypertension Maternal Grandfather    Early death Paternal Grandfather    Heart disease Paternal Grandfather      ROS:  Denies fever, fatigue, unexplained weight loss/gain, chest pain, SHOB, and palpitations. Denies neurological deficits, gastrointestinal or genitourinary complaints, and skin changes.   Objective:   Today's Vitals   10/09/23 0918  BP: 128/84  Pulse: 80  Resp: 16  SpO2: 95%  Weight: 251 lb 9.6 oz (114.1 kg)  Height: 5' 5 (1.651 m)  PainSc: 0-No pain    GENERAL APPEARANCE: Well-appearing, in NAD. Well nourished.  SKIN: Pink, warm and dry.  Turgor normal. No rash, lesion, ulceration, or ecchymoses. Hair evenly distributed.  HEENT: HEAD: Normocephalic.  EYES: PERRLA. EOMI. Lids intact w/o defect. Sclera white, Conjunctiva pink w/o exudate.  EARS: External ear w/o redness, swelling, masses or lesions. EAC clear. TM's intact, translucent w/o bulging, appropriate landmarks visualized. Appropriate acuity to conversational tones.  NOSE: Septum midline w/o deformity. Nares patent, mucosa pink and non-inflamed w/o drainage. No sinus tenderness.  THROAT: Uvula midline. Oropharynx clear. Tonsils non-inflamed w/o exudate. Oral mucosa pink and moist.  NECK: Supple, Trachea midline. Full ROM w/o pain or tenderness. No lymphadenopathy. Thyroid  non-tender w/o enlargement or palpable masses.  BREASTS: Deferred.  RESPIRATORY: Chest wall symmetrical w/o masses. Respirations even and non-labored. Breath sounds clear to auscultation bilaterally. No wheezes, rales, rhonchi, or crackles. CARDIAC: S1, S2 present, regular rate and rhythm. No gallops, murmurs, rubs, or clicks. PMI w/o lifts, heaves, or thrills. No carotid bruits. Capillary refill <2 seconds. Peripheral pulses 2+ bilaterally. GI: Abdomen soft w/o distention. Normoactive bowel sounds. No palpable masses or tenderness. No guarding or rebound tenderness. Liver and spleen w/o tenderness or enlargement. No CVA tenderness.  GU: External genitalia without erythema, lesions, or masses. No lymphadenopathy. Vaginal mucosa pink and moist without exudate,  lesions, or ulcerations. Cervix pink without discharge. Cervical os closed. Uterus and adnexae palpable, not enlarged, and w/o tenderness. No palpable masses.  MSK: Muscle tone and strength appropriate for age, w/o atrophy or abnormal movement.  EXTREMITIES: Active ROM intact, w/o tenderness, crepitus, or contracture. No obvious joint deformities or effusions. No clubbing, edema, or cyanosis.  NEUROLOGIC: CN's II-XII intact. Motor strength symmetrical with no obvious weakness. No sensory deficits. DTR's 2+ symmetric bilaterally. Steady, even gait.  PSYCH/MENTAL STATUS: Alert, oriented x 3. Cooperative, appropriate mood and affect.   Chaperoned by Warren Chiles, CMA   Results for orders placed or performed during the hospital encounter of 08/07/23  CBC   Collection Time: 08/07/23  3:45 PM  Result Value Ref Range   WBC 8.8 4.0 - 10.5 K/uL   RBC 4.86 3.87 - 5.11 MIL/uL   Hemoglobin 14.1 12.0 - 15.0 g/dL   HCT 57.2 63.9 - 53.9 %   MCV 87.9 80.0 - 100.0 fL   MCH 29.0 26.0 - 34.0 pg   MCHC 33.0 30.0 - 36.0 g/dL   RDW 86.5 88.4 - 84.4 %   Platelets 342 150 - 400 K/uL   nRBC 0.0 0.0 - 0.2 %  Comprehensive metabolic panel   Collection Time: 08/07/23  3:45 PM  Result Value Ref Range   Sodium 138 135 - 145 mmol/L   Potassium 3.9 3.5 - 5.1 mmol/L   Chloride 106 98 - 111 mmol/L   CO2 20 (L) 22 - 32 mmol/L   Glucose, Bld 98 70 - 99 mg/dL   BUN 14 6 - 20 mg/dL   Creatinine, Ser 9.21 0.44 - 1.00 mg/dL   Calcium 9.1 8.9 - 89.6 mg/dL   Total Protein 7.4 6.5 - 8.1 g/dL   Albumin 3.5 3.5 - 5.0 g/dL   AST 43 (H) 15 - 41 U/L   ALT 97 (H) 0 - 44 U/L   Alkaline Phosphatase 62 38 - 126 U/L   Total Bilirubin 0.6 0.0 - 1.2 mg/dL   GFR, Estimated >39 >39 mL/min   Anion gap 12 5 - 15  Lipase, blood   Collection Time: 08/07/23  3:45 PM  Result Value Ref Range   Lipase 32 11 - 51 U/L  I-Stat CG4 Lactic Acid  Collection Time: 08/07/23  3:49 PM  Result Value Ref Range   Lactic Acid, Venous 0.6 0.5 -  1.9 mmol/L  Urinalysis, Routine w reflex microscopic -Urine, Clean Catch   Collection Time: 08/07/23  4:52 PM  Result Value Ref Range   Color, Urine YELLOW YELLOW   APPearance HAZY (A) CLEAR   Specific Gravity, Urine 1.023 1.005 - 1.030   pH 5.0 5.0 - 8.0   Glucose, UA NEGATIVE NEGATIVE mg/dL   Hgb urine dipstick NEGATIVE NEGATIVE   Bilirubin Urine NEGATIVE NEGATIVE   Ketones, ur NEGATIVE NEGATIVE mg/dL   Protein, ur NEGATIVE NEGATIVE mg/dL   Nitrite NEGATIVE NEGATIVE   Leukocytes,Ua MODERATE (A) NEGATIVE   RBC / HPF 0-5 0 - 5 RBC/hpf   WBC, UA 0-5 0 - 5 WBC/hpf   Bacteria, UA RARE (A) NONE SEEN   Squamous Epithelial / HPF 0-5 0 - 5 /HPF   Mucus PRESENT   Pregnancy, urine   Collection Time: 08/07/23  4:52 PM  Result Value Ref Range   Preg Test, Ur NEGATIVE NEGATIVE  POC occult blood, ED   Collection Time: 08/07/23  5:33 PM  Result Value Ref Range   Fecal Occult Bld NEGATIVE NEGATIVE    Assessment & Plan:   1. Wellness examination (Primary) - Encouraged a healthy well-balanced diet. Patient may adjust caloric intake to maintain or achieve ideal body weight. May reduce intake of dietary saturated fat and total fat and have adequate dietary potassium and calcium preferably from fresh fruits, vegetables, and low-fat dairy products.   - Advised to avoid cigarette smoking. - Discussed with the patient that most people either abstain from alcohol or drink within safe limits (<=14/week and <=4 drinks/occasion for males, <=7/weeks and <= 3 drinks/occasion for females) and that the risk for alcohol disorders and other health effects rises proportionally with the number of drinks per week and how often a drinker exceeds daily limits. - Discussed cessation/primary prevention of drug use and availability of treatment for abuse.  - Discussed sexually transmitted diseases, avoidance of unintended pregnancy and contraceptive alternatives. - Stressed the importance of regular exercise - Injury  prevention: Discussed safety belts, safety helmets, smoke detector, smoking near bedding or upholstery.  - Dental health: Discussed importance of regular tooth brushing, flossing, and dental visits.  NEXT PREVENTATIVE PHYSICAL DUE IN 1 YEAR.  2. Healthcare maintenance Will obtain fasting labs today.  - CBC with Differential/Platelet - Comprehensive metabolic panel with GFR - Hemoglobin A1c - Lipid panel - TSH Rfx on Abnormal to Free T4  3. Screening for cervical cancer Patient is agreeable to procedure today. Clinical breast exam declined. Visual inspection of external genitalia of vagina. No inguinal lymphadenopathy palpated. Vaginal walls and cervix appear pink during speculum examination. Cervical os visualized. Pap completed with no complications. Will update patient with results.  - Cytology - PAP  4. Need for immunization against influenza Influenza immunization given today.   5. Chronic midline thoracic back pain Patient suffers from chronic back pain due to large breasts.   6. Inattention Referral placed to ADHD testing.  - Ambulatory referral to Psychology  Return in about 1 year (around 10/08/2024) for Physical with fasting labs.  Patient to reach out to office if new, worrisome, or unresolved symptoms arise or if no improvement in patient's condition. Patient verbalized understanding and is agreeable to treatment plan. All questions answered to patient's satisfaction.   Evalene Arts, FNP

## 2023-10-09 NOTE — Addendum Note (Signed)
 Addended by: Tamkia Temples A on: 10/09/2023 02:37 PM   Modules accepted: Orders

## 2023-10-10 ENCOUNTER — Ambulatory Visit: Payer: Self-pay | Admitting: Family Medicine

## 2023-10-10 LAB — CBC WITH DIFFERENTIAL/PLATELET
Basophils Absolute: 0.1 x10E3/uL (ref 0.0–0.2)
Basos: 1 %
EOS (ABSOLUTE): 0.4 x10E3/uL (ref 0.0–0.4)
Eos: 5 %
Hematocrit: 45.3 % (ref 34.0–46.6)
Hemoglobin: 14.7 g/dL (ref 11.1–15.9)
Immature Grans (Abs): 0 x10E3/uL (ref 0.0–0.1)
Immature Granulocytes: 0 %
Lymphocytes Absolute: 2 x10E3/uL (ref 0.7–3.1)
Lymphs: 28 %
MCH: 29.2 pg (ref 26.6–33.0)
MCHC: 32.5 g/dL (ref 31.5–35.7)
MCV: 90 fL (ref 79–97)
Monocytes Absolute: 0.5 x10E3/uL (ref 0.1–0.9)
Monocytes: 7 %
Neutrophils Absolute: 4.2 x10E3/uL (ref 1.4–7.0)
Neutrophils: 59 %
Platelets: 271 x10E3/uL (ref 150–450)
RBC: 5.04 x10E6/uL (ref 3.77–5.28)
RDW: 13 % (ref 11.7–15.4)
WBC: 7.2 x10E3/uL (ref 3.4–10.8)

## 2023-10-10 LAB — COMPREHENSIVE METABOLIC PANEL WITH GFR
ALT: 43 IU/L — ABNORMAL HIGH (ref 0–32)
AST: 26 IU/L (ref 0–40)
Albumin: 4.2 g/dL (ref 3.9–4.9)
Alkaline Phosphatase: 73 IU/L (ref 44–121)
BUN/Creatinine Ratio: 18 (ref 9–23)
BUN: 11 mg/dL (ref 6–20)
Bilirubin Total: 0.3 mg/dL (ref 0.0–1.2)
CO2: 19 mmol/L — ABNORMAL LOW (ref 20–29)
Calcium: 9.5 mg/dL (ref 8.7–10.2)
Chloride: 103 mmol/L (ref 96–106)
Creatinine, Ser: 0.61 mg/dL (ref 0.57–1.00)
Globulin, Total: 2.8 g/dL (ref 1.5–4.5)
Glucose: 88 mg/dL (ref 70–99)
Potassium: 4.8 mmol/L (ref 3.5–5.2)
Sodium: 137 mmol/L (ref 134–144)
Total Protein: 7 g/dL (ref 6.0–8.5)
eGFR: 121 mL/min/1.73 (ref >59)

## 2023-10-10 LAB — LIPID PANEL
Chol/HDL Ratio: 3 ratio (ref 0.0–4.4)
Cholesterol, Total: 147 mg/dL (ref 100–199)
HDL: 49 mg/dL (ref >39)
LDL Chol Calc (NIH): 82 mg/dL (ref 0–99)
Triglycerides: 85 mg/dL (ref 0–149)
VLDL Cholesterol Cal: 16 mg/dL (ref 5–40)

## 2023-10-10 LAB — TSH RFX ON ABNORMAL TO FREE T4: TSH: 1.83 u[IU]/mL (ref 0.450–4.500)

## 2023-10-10 LAB — HEMOGLOBIN A1C
Est. average glucose Bld gHb Est-mCnc: 97 mg/dL
Hgb A1c MFr Bld: 5 % (ref 4.8–5.6)

## 2023-10-12 LAB — CYTOLOGY - PAP
Comment: NEGATIVE
Diagnosis: NEGATIVE
Diagnosis: REACTIVE
High risk HPV: NEGATIVE

## 2023-11-28 ENCOUNTER — Other Ambulatory Visit: Payer: Self-pay | Admitting: Medical Genetics

## 2023-11-28 DIAGNOSIS — Z006 Encounter for examination for normal comparison and control in clinical research program: Secondary | ICD-10-CM

## 2023-12-24 ENCOUNTER — Ambulatory Visit: Payer: Self-pay

## 2024-01-19 ENCOUNTER — Encounter: Payer: Self-pay | Admitting: Emergency Medicine

## 2024-01-19 ENCOUNTER — Ambulatory Visit
Admission: EM | Admit: 2024-01-19 | Discharge: 2024-01-19 | Disposition: A | Discharge status: Home / Self Care | Attending: Emergency Medicine | Admitting: Emergency Medicine

## 2024-01-19 DIAGNOSIS — B349 Viral infection, unspecified: Secondary | ICD-10-CM | POA: Diagnosis not present

## 2024-01-19 DIAGNOSIS — H04123 Dry eye syndrome of bilateral lacrimal glands: Secondary | ICD-10-CM | POA: Diagnosis not present

## 2024-01-19 DIAGNOSIS — H9201 Otalgia, right ear: Secondary | ICD-10-CM

## 2024-01-19 LAB — POCT RAPID STREP A (OFFICE): Rapid Strep A Screen: NEGATIVE

## 2024-01-19 LAB — POC COVID19/FLU A&B COMBO
Covid Antigen, POC: NEGATIVE
Influenza A Antigen, POC: NEGATIVE
Influenza B Antigen, POC: NEGATIVE

## 2024-01-19 MED ORDER — CYCLOSPORINE 0.05 % OP EMUL: 1.0000 [drp] | Freq: Two times a day (BID) | OPHTHALMIC | 0 refills | Status: AC

## 2024-01-19 NOTE — Discharge Instructions (Addendum)
 Your symptoms today are most likely being caused by a virus and should steadily improve in time it can take up to 7 to 10 days before you truly start to see a turnaround however things will get better  COVID flu and strep testing negative  You may use cyclosporine eyedrops twice daily to help with dryness of the eyes  On exam there is no abnormality to the ear indicating infection, symptoms most likely related to nasal congestion in the sinuses, begin attempting any of the following below to help reduce symptoms    You can take Tylenol  and/or Ibuprofen  as needed for fever reduction and pain relief.   For cough: honey 1/2 to 1 teaspoon (you can dilute the honey in water or another fluid).  You can also use guaifenesin  and dextromethorphan for cough. You can use a humidifier for chest congestion and cough.  If you don't have a humidifier, you can sit in the bathroom with the hot shower running.      For sore throat: try warm salt water gargles, cepacol lozenges, throat spray, warm tea or water with lemon/honey, popsicles or ice, or OTC cold relief medicine for throat discomfort.   For congestion: take a daily anti-histamine like Zyrtec, Claritin, and a oral decongestant, such as pseudoephedrine.  You can also use Flonase 1-2 sprays in each nostril daily.   It is important to stay hydrated: drink plenty of fluids (water, gatorade/powerade/pedialyte, juices, or teas) to keep your throat moisturized and help further relieve irritation/discomfort.

## 2024-01-19 NOTE — ED Provider Notes (Signed)
 " [facility]    CSN: [id number] Arrival date & time: 01/19/24  0801      History   Chief Complaint Chief Complaint  Patient presents with   Otalgia   Eye Problem    HPI Ashley [name] is a 33 y.o. female.   Patient presents for evaluation of nasal congestion, right-sided ear pain, sore throat, nonproductive cough and intermittent diarrhea beginning 3 days ago.  Right sided ear pain interfering with sleep, described as a throbbing.  Associated dryness to the eyes but denies presence of drainage itching or pain.  Tolerable to food and liquids but appetite is decreased.  Has attempted to apply peroxide to the ear without relief.  No known sick contacts in household.  Denies fever.   Past Medical History:  Diagnosis Date   Anxiety    Depression    High grade squamous intraepithelial lesion (HGSIL), grade 2 CIN, on biopsy of cervix    S/P LEEP (loop electrosurgical excision procedure)     Patient Active Problem List   Diagnosis Date Noted   Sinus tachycardia 08/13/2023   Anxiety about health 08/03/2023   Hyponatremia 07/21/2023   Tonsillitis 07/21/2023   Rash 07/21/2023   Abnormal O'Sullivan glucose challenge test, antepartum 06/25/2022   Anxiety 05/11/2021    Past Surgical History:  Procedure Laterality Date   CESAREAN SECTION  09/09/2022   CYST EXCISION Left 05/28/2019   Procedure: EXCISION OF  DORSAL CYST LEFT WRIST;  Surgeon: Murrell Kuba, MD;  Location: [facility];  Service: Orthopedics;  Laterality: Left;  IV REGIONAL UPPER ARM BLOCK   laproscopic surgery for ruptured fallopian tube  2018    OB History   No obstetric history on file.      Home Medications    Prior to Admission medications  Not on File    Family History Family History  Problem Relation Age of Onset   Hypertension Mother    Heart block Mother    ADD / ADHD Mother    Miscarriages / Stillbirths Mother    Coronary artery disease Mother    Depression Father     Hearing loss Sister    Miscarriages / Stillbirths Sister    ADD / ADHD Maternal Aunt    Miscarriages / Stillbirths Maternal Aunt    Cancer Paternal Uncle    ADD / ADHD Maternal Grandmother    Varicose Veins Maternal Grandmother    Arthritis Maternal Grandfather    Hypertension Maternal Grandfather    Early death Paternal Grandfather    Heart disease Paternal Grandfather     Social History Social History[1]   Allergies   Patient has no known allergies.   Review of Systems Review of Systems  Constitutional: Negative.   HENT:  Positive for congestion, ear pain and sore throat. Negative for dental problem, drooling, ear discharge, facial swelling, hearing loss, mouth sores, nosebleeds, postnasal drip, rhinorrhea, sinus pressure, sinus pain, sneezing, tinnitus, trouble swallowing and voice change.   Respiratory:  Positive for cough. Negative for apnea, choking, chest tightness, shortness of breath, wheezing and stridor.   Gastrointestinal:  Positive for diarrhea. Negative for abdominal distention, abdominal pain, anal bleeding, blood in stool, constipation, nausea, rectal pain and vomiting.  Skin: Negative.      Physical Exam Triage Vital Signs ED Triage Vitals  Encounter Vitals Group     BP 01/19/24 0811 106/66     Girls Systolic BP Percentile --      Girls Diastolic BP Percentile --  Boys Systolic BP Percentile --      Boys Diastolic BP Percentile --      Pulse Rate 01/19/24 0811 98     Resp 01/19/24 0811 20     Temp 01/19/24 0811 98 F (36.7 C)     Temp Source 01/19/24 0811 Oral     SpO2 01/19/24 0811 97 %     Weight --      Height --      Head Circumference --      Peak Flow --      Pain Score 01/19/24 0813 4     Pain Loc --      Pain Education --      Exclude from Growth Chart --    No data found.  Updated Vital Signs BP 106/66 (BP Location: Left Arm)   Pulse 98   Temp 98 F (36.7 C) (Oral)   Resp 20   LMP 01/16/2024 (Exact Date)   SpO2 97%    Visual Acuity Right Eye Distance: 20/30 Left Eye Distance: 20/20 Bilateral Distance: 20/16  Right Eye Near:   Left Eye Near:    Bilateral Near:     Physical Exam Constitutional:      Appearance: Normal appearance.  HENT:     Head: Normocephalic.     Right Ear: Tympanic membrane, ear canal and external ear normal.     Left Ear: Tympanic membrane, ear canal and external ear normal.     Nose: Congestion present.     Mouth/Throat:     Pharynx: No oropharyngeal exudate or posterior oropharyngeal erythema.     Tonsils: Tonsillar exudate present. 2+ on the right. 3+ on the left.  Eyes:     Extraocular Movements: Extraocular movements intact.  Cardiovascular:     Rate and Rhythm: Normal rate.     Pulses: Normal pulses.     Heart sounds: Normal heart sounds.  Pulmonary:     Effort: Pulmonary effort is normal.     Breath sounds: Normal breath sounds.  Neurological:     Mental Status: She is alert.      UC Treatments / Results  Labs (all labs ordered are listed, but only abnormal results are displayed) Labs Reviewed  POC COVID19/FLU A&B COMBO    EKG   Radiology No results found.  Procedures Procedures (including critical care time)  Medications Ordered in UC Medications - No data to display  Initial Impression / Assessment and Plan / UC Course  I have reviewed the triage vital signs and the nursing notes.  Pertinent labs & imaging results that were available during my care of the patient were reviewed by me and considered in my medical decision making (see chart for details).  Viral illness, otalgia of the right ear, dry eyes, bilateral  Patient is in no signs of distress nor toxic appearing.  Vital signs are stable.  Low suspicion for pneumonia, pneumothorax or bronchitis and therefore will defer imaging.  COVID flu and strep test negative, discussed findings with patient etiology viral, sick contacts in household.  Prescribed Restasis  for dryness to the eyes  offered prednisone and nasal spray which she declined.  Recommended over-the-counter medications as needed for supportive care.  May follow-up with urgent care as needed if symptoms persist or worsen.     Final Clinical Impressions(s) / UC Diagnoses   Final diagnoses:  Otalgia, right ear   Discharge Instructions   None    ED Prescriptions   None    PDMP  not reviewed this encounter.     [1]  Social History Tobacco Use   Smoking status: Never    Passive exposure: Never   Smokeless tobacco: Never  Vaping Use   Vaping status: Never Used  Substance Use Topics   Alcohol use: Not Currently    Comment: occas   Drug use: Never     Teresa Shelba SAUNDERS, NP 01/19/24 0857  "

## 2024-01-19 NOTE — ED Triage Notes (Signed)
 Patient complains of right ear pain and left eye dryness x 3 days. Patient used peroxide in ear with mild relief. Rates pain 4/10.

## 2024-03-08 ENCOUNTER — Ambulatory Visit: Payer: Self-pay

## 2024-10-01 ENCOUNTER — Ambulatory Visit

## 2024-10-08 ENCOUNTER — Encounter: Admitting: Family Medicine
